# Patient Record
Sex: Female | Born: 1955
Health system: Southern US, Community
[De-identification: ages and names within clinical notes are randomized; demographics above are authoritative.]

## PROBLEM LIST (undated history)

## (undated) DIAGNOSIS — L723 Sebaceous cyst: Secondary | ICD-10-CM

## (undated) DIAGNOSIS — E039 Hypothyroidism, unspecified: Secondary | ICD-10-CM

## (undated) DIAGNOSIS — L309 Dermatitis, unspecified: Secondary | ICD-10-CM

## (undated) DIAGNOSIS — E785 Hyperlipidemia, unspecified: Secondary | ICD-10-CM

## (undated) DIAGNOSIS — M47816 Spondylosis without myelopathy or radiculopathy, lumbar region: Secondary | ICD-10-CM

## (undated) DIAGNOSIS — K219 Gastro-esophageal reflux disease without esophagitis: Secondary | ICD-10-CM

## (undated) DIAGNOSIS — L089 Local infection of the skin and subcutaneous tissue, unspecified: Secondary | ICD-10-CM

## (undated) HISTORY — DX: Hyperlipidemia, unspecified: E78.5

## (undated) HISTORY — DX: Dermatitis, unspecified: L30.9

## (undated) HISTORY — PX: VAGINAL HYSTERECTOMY: SUR661

## (undated) HISTORY — DX: Gastro-esophageal reflux disease without esophagitis: K21.9

## (undated) HISTORY — DX: Hypothyroidism, unspecified: E03.9

## (undated) HISTORY — DX: Spondylosis without myelopathy or radiculopathy, lumbar region: M47.816

---

## 1998-08-12 ENCOUNTER — Encounter: Payer: Self-pay | Admitting: Family Medicine

## 1998-08-12 ENCOUNTER — Ambulatory Visit (HOSPITAL_COMMUNITY): Admission: RE | Admit: 1998-08-12 | Discharge: 1998-08-12 | Payer: Self-pay | Admitting: Family Medicine

## 2000-01-26 ENCOUNTER — Ambulatory Visit (HOSPITAL_COMMUNITY): Admission: RE | Admit: 2000-01-26 | Discharge: 2000-01-26 | Payer: Self-pay | Admitting: Gastroenterology

## 2000-01-26 ENCOUNTER — Encounter: Payer: Self-pay | Admitting: Gastroenterology

## 2001-06-15 ENCOUNTER — Other Ambulatory Visit: Admission: RE | Admit: 2001-06-15 | Discharge: 2001-06-15 | Payer: Self-pay | Admitting: Family Medicine

## 2001-06-17 ENCOUNTER — Ambulatory Visit (HOSPITAL_COMMUNITY): Admission: RE | Admit: 2001-06-17 | Discharge: 2001-06-17 | Payer: Self-pay | Admitting: Family Medicine

## 2001-06-17 ENCOUNTER — Encounter: Payer: Self-pay | Admitting: Family Medicine

## 2001-06-20 ENCOUNTER — Ambulatory Visit (HOSPITAL_BASED_OUTPATIENT_CLINIC_OR_DEPARTMENT_OTHER): Admission: RE | Admit: 2001-06-20 | Discharge: 2001-06-20 | Payer: Self-pay | Admitting: Surgery

## 2002-06-23 ENCOUNTER — Other Ambulatory Visit: Admission: RE | Admit: 2002-06-23 | Discharge: 2002-06-23 | Payer: Self-pay | Admitting: Family Medicine

## 2003-04-08 ENCOUNTER — Ambulatory Visit (HOSPITAL_COMMUNITY): Admission: RE | Admit: 2003-04-08 | Discharge: 2003-04-08 | Payer: Self-pay | Admitting: Family Medicine

## 2005-04-20 ENCOUNTER — Other Ambulatory Visit: Admission: RE | Admit: 2005-04-20 | Discharge: 2005-04-20 | Payer: Self-pay | Admitting: *Deleted

## 2007-03-16 ENCOUNTER — Emergency Department (HOSPITAL_COMMUNITY): Admission: EM | Admit: 2007-03-16 | Discharge: 2007-03-16 | Payer: Self-pay | Admitting: Emergency Medicine

## 2009-03-07 ENCOUNTER — Other Ambulatory Visit: Admission: RE | Admit: 2009-03-07 | Discharge: 2009-03-07 | Payer: Self-pay | Admitting: Family Medicine

## 2009-04-01 IMAGING — CT CT HEAD W/O CM
1 series · 16 of 30 positions shown, 20 images · non-contrast
Comparison: None.

CLINICAL DATA: Syncope, headache, and blurred vision.
 HEAD CT WITHOUT CONTRAST ? 03/16/07:
TECHNIQUE: Contiguous axial CT images were obtained from the base of the skull through the vertex according to standard protocol without contrast.

[Series 2: head_seq 4.5 h37s st · axial · 0.43mm/px · z∈[+1063,+1189]mm · 16 of 32 slices shown, 20 images]
[im 2/32  brain]
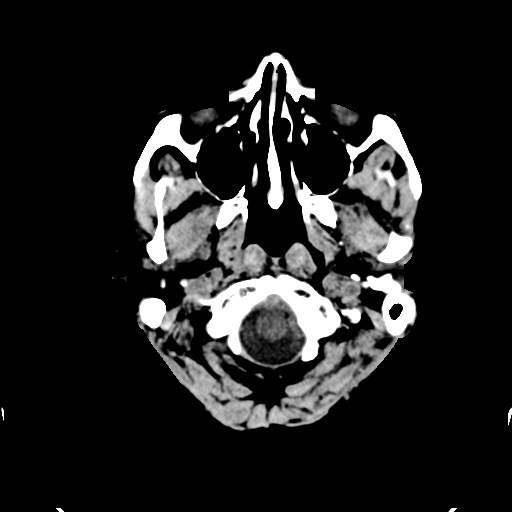
[im 2/32  bone]
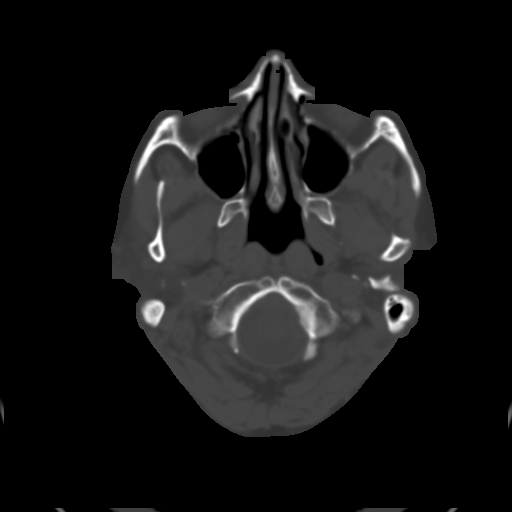
[im 4/32  brain]
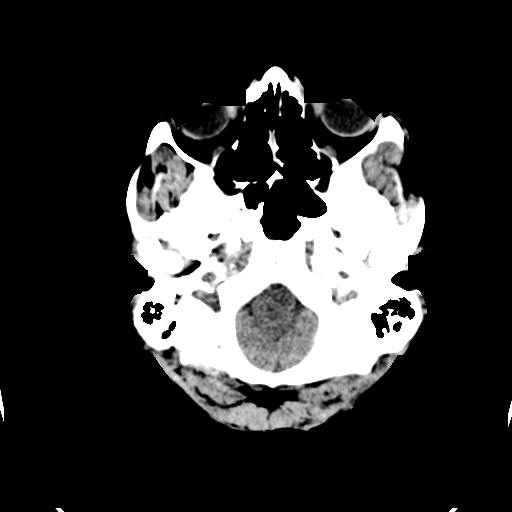
[im 6/32  brain]
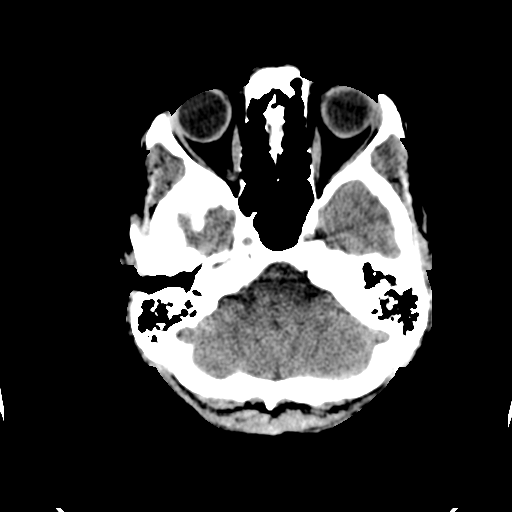
[im 8/32  brain]
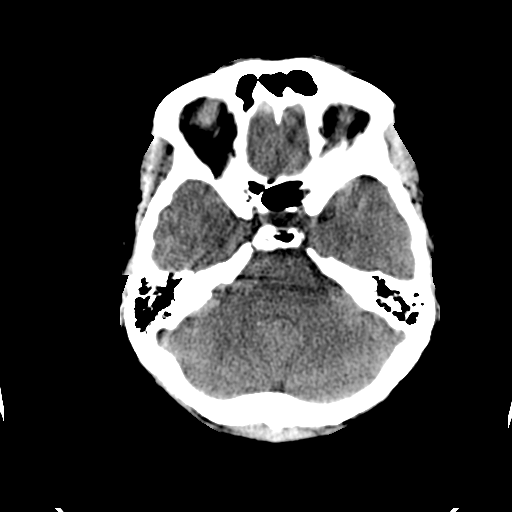
[im 9/32  brain]
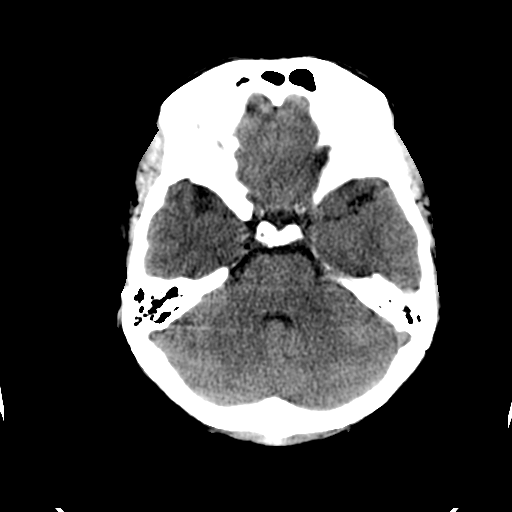
[im 9/32  bone]
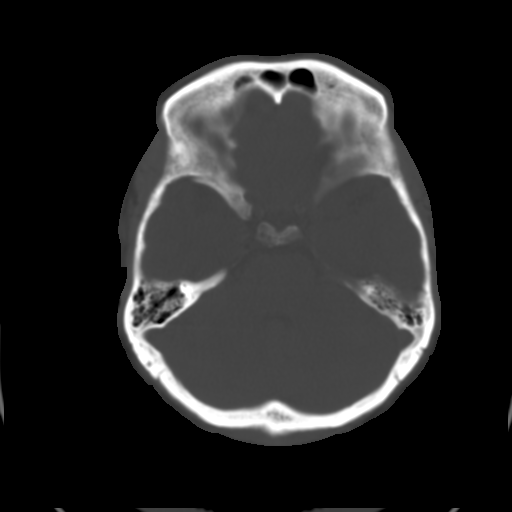
[im 11/32  brain]
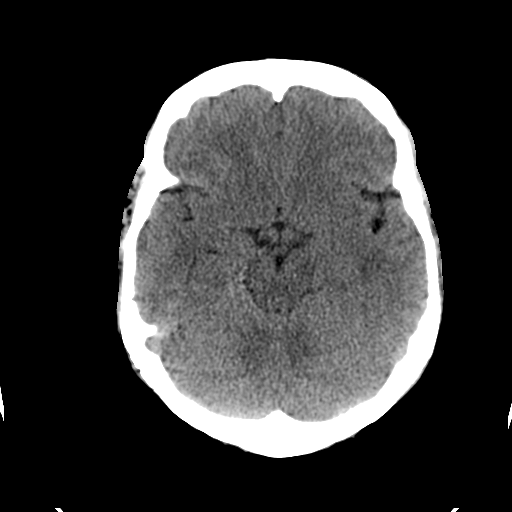
[im 13/32  brain]
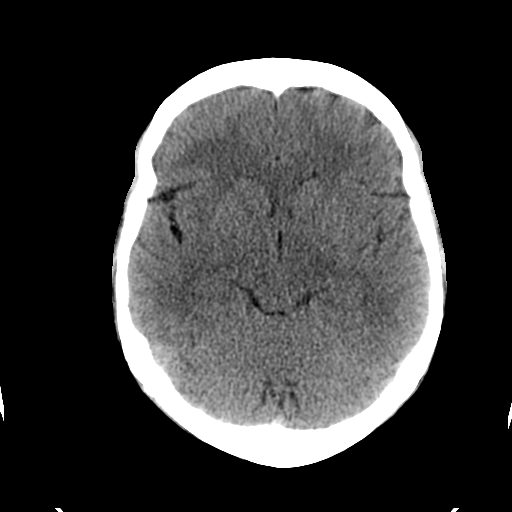
[im 15/32  brain]
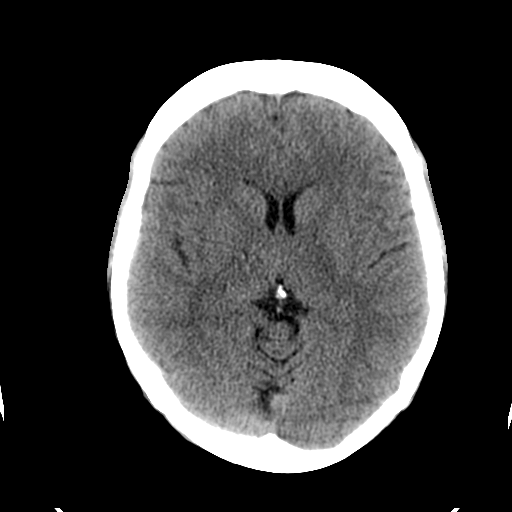
[im 17/32  brain]
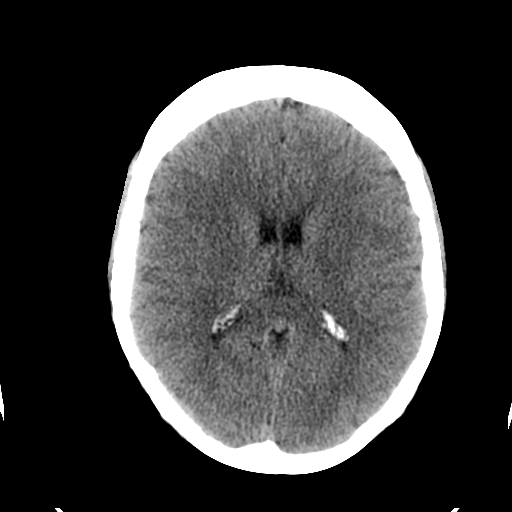
[im 17/32  bone]
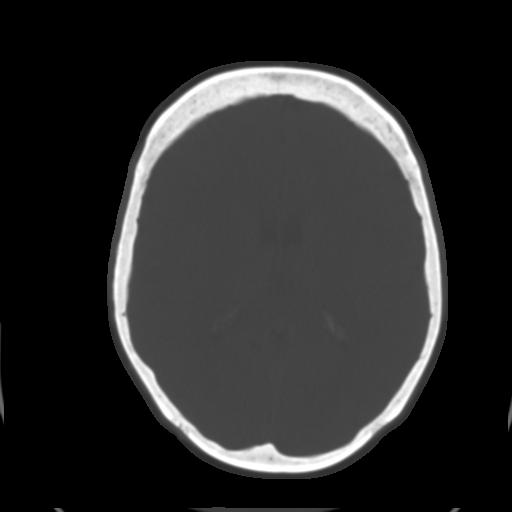
[im 19/32  brain]
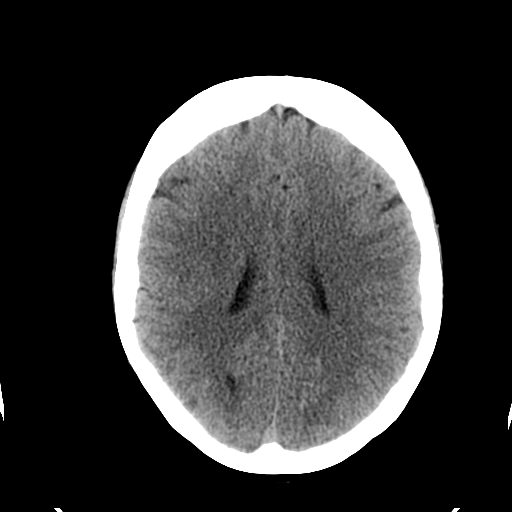
[im 21/32  brain]
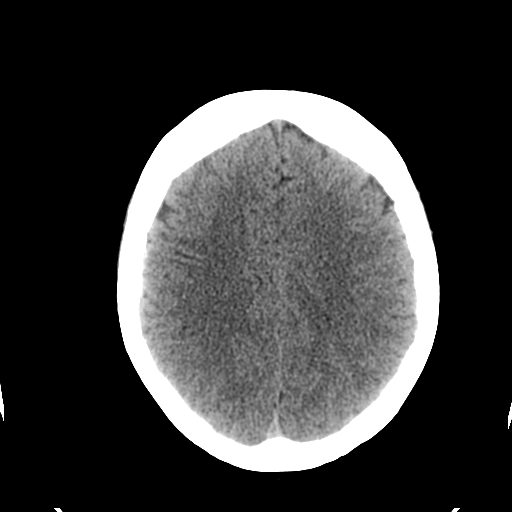
[im 23/32  brain]
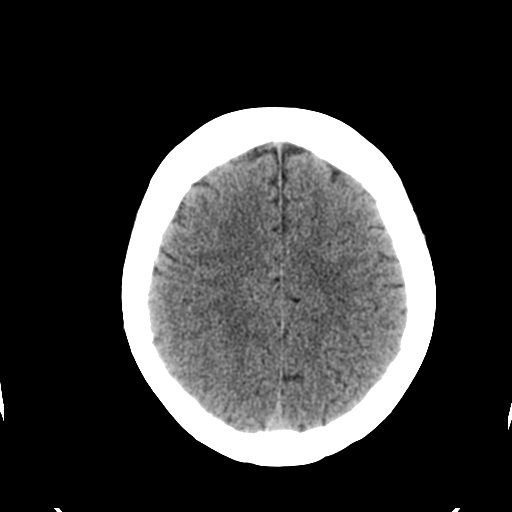
[im 24/32  brain]
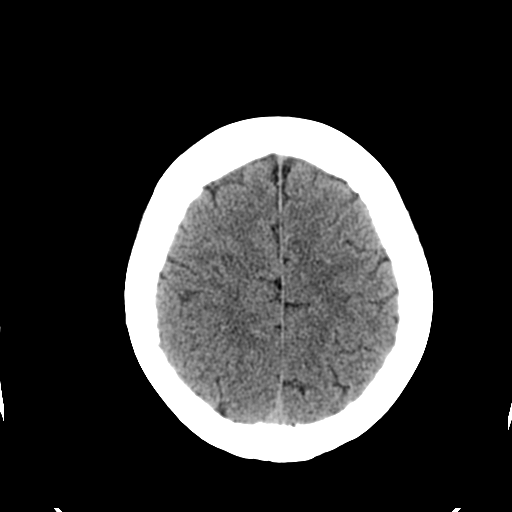
[im 24/32  bone]
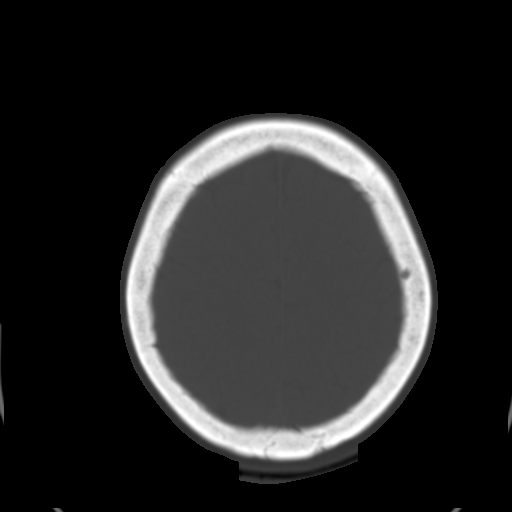
[im 26/32  brain]
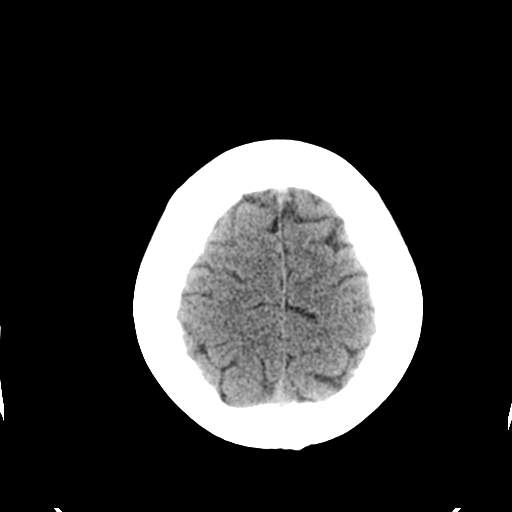
[im 28/32  brain]
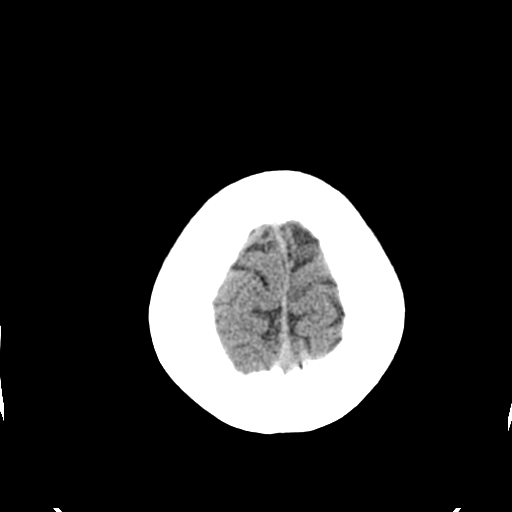
[im 30/32  brain]
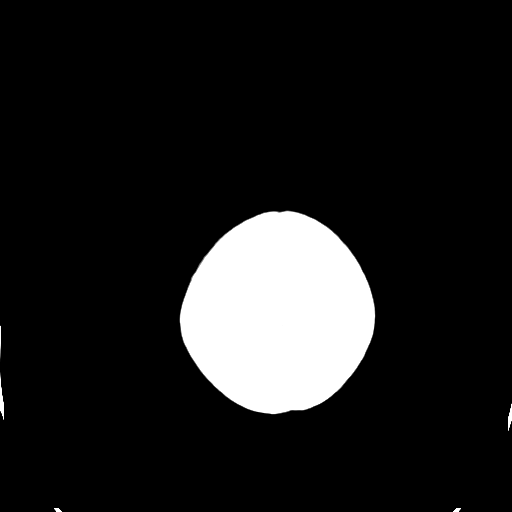

[16 of 30 positions shown; findings below may reference images not displayed]

FINDINGS: The cerebral and cerebellar hemispheres are normal in attenuation and morphology.  
 The midline is maintained.  There is no edema or mass effect.  
 There is no abnormal extra-axial fluid collection, intracranial hemorrhage, or mass.
 There is no evidence for acute cerebral infarction or hemorrhage.
 The mastoid air cells and paranasal sinuses are normally aerated.
IMPRESSION: No acute findings. 

 t

## 2010-05-23 NOTE — Op Note (Signed)
[place]  Patient:    [name], [name] Visit Number: [id number] MRN: [id number]          Service Type: DSU Location: [facility] Attending Physician:  Shelly Rubenstein Dictated by:   Abigail Miyamoto, M.D. Proc. Date: 06/10/01 Admit Date:  06/20/2001                             Operative Report  PREOPERATIVE DIAGNOSIS:  Chronic perianal abscess.  POSTOPERATIVE DIAGNOSIS:  Chronic perianal abscess.  OPERATION PERFORMED: 1. Examination under anesthesia. 2. Excision of chronic perianal abscess.  SURGEON:  Abigail Miyamoto, M.D.  ANESTHESIA:  General endotracheal anesthesia and 0.25% Marcaine with epinephrine.  ESTIMATED BLOOD LOSS:  Minimal.  INDICATIONS FOR PROCEDURE:  The patient is a pleasant 55 year old female who presents with recurrent perianal abscesses.  These have all occurred in the same location.  Secondary to this, the decision was made to proceed with examination under anesthesia.  FINDINGS:  The patient was found to have a normal anal canal.  No fistula was identified.  At this point decision was made to excise the area of the recurrent abscesses.  DESCRIPTION OF PROCEDURE:  Patient brought to operating room and identified as Jessica Farmer.  She was placed supine on the operating table and general anesthesia was induced.  The patient was then placed in the prone position. Her perineum and perianal area were then prepped and draped in the usual sterile fashion.  The Hill-Ferguson retractor was then inserted in the anus. The patient was found to have a large amount of stool which was removed. Examination revealed a normal anal canal.  The old abscess scar had healed well and no punctum could be identified.  At this point with no abnormalities on the inside to indicate a fistula, the decision was made to excise the abscess area.  At this point an ellipse was made around the old scar. Incision was carried down to the  perianal tissue which was then completely excised circumferentially.  Again, the fistula probe was inserted deep into this cavity and again no tract could be identified.  At this point a curet was brought onto the field and then used to curet out all the chronic tissue in the abscess cavity.  The wound was then irrigated with normal saline and then anesthetized with 0.25% Marcaine.  The skin was then closed with several interrupted 3-0 chromic sutures.  The patient tolerated the procedure well. All sponge, needle and instrument counts were correct at the end of the procedure.  The patient was then extubated in the operating room and taken in stable condition to the recovery room. Dictated by:   Abigail Miyamoto, M.D. Attending Physician:  Shelly Rubenstein DD:  06/20/01 TD:  06/21/01 Job: [id number]

## 2010-08-27 ENCOUNTER — Other Ambulatory Visit (HOSPITAL_COMMUNITY)
Admission: RE | Admit: 2010-08-27 | Discharge: 2010-08-27 | Disposition: A | Discharge status: Home / Self Care | Payer: Managed Care, Other (non HMO) | Source: Ambulatory Visit | Attending: Family Medicine | Admitting: Family Medicine

## 2010-08-27 ENCOUNTER — Other Ambulatory Visit: Payer: Self-pay | Admitting: Family Medicine

## 2010-08-27 DIAGNOSIS — Z124 Encounter for screening for malignant neoplasm of cervix: Secondary | ICD-10-CM | POA: Insufficient documentation

## 2010-09-29 LAB — DIFFERENTIAL
Basophils Absolute: 0
Basophils Relative: 0
Eosinophils Relative: 0
Lymphocytes Relative: 35
Monocytes Absolute: 0.6

## 2010-09-29 LAB — BASIC METABOLIC PANEL
BUN: 9
CO2: 28
Calcium: 9.8
GFR calc non Af Amer: >60
Glucose, Bld: 102 — ABNORMAL HIGH
Potassium: 3.9

## 2010-09-29 LAB — CBC
HCT: 38.8
Hemoglobin: 13.5
MCHC: 34.9
Platelets: 225
RDW: 13.5

## 2011-06-11 ENCOUNTER — Ambulatory Visit: Payer: Managed Care, Other (non HMO) | Admitting: Audiology

## 2011-06-18 ENCOUNTER — Ambulatory Visit: Payer: Managed Care, Other (non HMO) | Admitting: Audiology

## 2011-07-06 ENCOUNTER — Ambulatory Visit: Payer: Managed Care, Other (non HMO) | Attending: Family Medicine | Admitting: Audiology

## 2011-07-06 DIAGNOSIS — H903 Sensorineural hearing loss, bilateral: Secondary | ICD-10-CM | POA: Insufficient documentation

## 2011-07-07 ENCOUNTER — Other Ambulatory Visit: Payer: Self-pay | Admitting: Family Medicine

## 2011-07-07 DIAGNOSIS — H9319 Tinnitus, unspecified ear: Secondary | ICD-10-CM

## 2011-07-08 ENCOUNTER — Ambulatory Visit
Admission: RE | Admit: 2011-07-08 | Discharge: 2011-07-08 | Disposition: A | Discharge status: Home / Self Care | Payer: Managed Care, Other (non HMO) | Source: Ambulatory Visit | Attending: Family Medicine | Admitting: Family Medicine

## 2011-07-08 DIAGNOSIS — H9319 Tinnitus, unspecified ear: Secondary | ICD-10-CM

## 2011-07-08 MED ORDER — GADOBENATE DIMEGLUMINE 529 MG/ML IV SOLN
15.0000 mL | Freq: Once | INTRAVENOUS | Status: AC | PRN
  Administered 2011-07-08: 15 mL via INTRAVENOUS

## 2013-04-21 ENCOUNTER — Encounter: Payer: Self-pay | Admitting: Diagnostic Neuroimaging

## 2013-04-21 ENCOUNTER — Encounter (INDEPENDENT_AMBULATORY_CARE_PROVIDER_SITE_OTHER): Payer: Self-pay

## 2013-04-21 ENCOUNTER — Ambulatory Visit (INDEPENDENT_AMBULATORY_CARE_PROVIDER_SITE_OTHER): Payer: Medicare HMO | Admitting: Diagnostic Neuroimaging

## 2013-04-21 VITALS — BP 116/79 | HR 92 | Ht 63.0 in | Wt 171.0 lb

## 2013-04-21 DIAGNOSIS — G500 Trigeminal neuralgia: Secondary | ICD-10-CM

## 2013-04-21 MED ORDER — CARBAMAZEPINE ER 200 MG PO TB12: 200.0000 mg | ORAL_TABLET | Freq: Two times a day (BID) | ORAL | Status: DC

## 2013-04-21 NOTE — Progress Notes (Signed)
[facility]  PATIENT: Jessica Farmer DOB: 1955/10/01  REFERRING CLINICIAN: Drema Dallas HISTORY FROM: patient REASON FOR VISIT: new consult   HISTORICAL  CHIEF COMPLAINT:  Chief Complaint  Patient presents with  . Facial Pain    tingling on R side of bottom lip, jaw pain    HISTORY OF PRESENT ILLNESS:   58 year old right-handed female here for evaluation of right facial pain. For past 2 years patient has had intermittent shooting, sharp electrical pain in her right face. Symptoms radiate to her right lower lip. She has some numbness and tingling in the bottom lip on the right eye. Talking, eating, chewing sometimes triggers electrical burning zap sensations. Sometimes she has throbbing pain. Symptoms are worse in the wintertime. They affect her about 70% the day. She's tried Tylenol PM and hydrocodone without relief.  Also of note patient had poor dental hygiene as a child, lost family dental insurance, and ultimately lost her teeth at age 39 years old with complete dentures. 3 years ago her dentures were replaced in attempt to help these facial pain, but no change in sxs.   REVIEW OF SYSTEMS: Full 14 system review of systems performed and notable only for headache numbness surgeries to the swelling too much sleep none asleep decreased energy insomnia ringing in ears trouble swallowing cramps of feeling hot fatigue weight gain.  ALLERGIES: Allergies  Allergen Reactions  . Codeine     HOME MEDICATIONS: No outpatient prescriptions prior to visit.   No facility-administered medications prior to visit.    PAST MEDICAL HISTORY: Past Medical History  Diagnosis Date  . Hyperlipemia   . Esophageal reflux   . Eczema   . Hypothyroidism   . Arthritis, lumbar spine     PAST SURGICAL HISTORY: Past Surgical History  Procedure Laterality Date  . Vaginal hysterectomy  age 29    FAMILY HISTORY: Family History  Problem Relation Age of Onset  . Other  Mother     MRSA  . Hypertension Father   . Diabetes Sister   . Hypertension Sister   . Other Sister      trigeminal neuralgia  . Diabetes Brother     SOCIAL HISTORY:  History   Social History  . Marital Status: Married    Spouse Name: Ronalee Belts    Number of Children: 2  . Years of Education: 12th   Occupational History  .  [employer]   Social History Main Topics  . Smoking status: Current Every Day Smoker -- 0.50 packs/day for 40 years    Types: Cigarettes  . Smokeless tobacco: Never Used  . Alcohol Use: Yes     Comment: twice a week  . Drug Use: No  . Sexual Activity: Not on file   Other Topics Concern  . Not on file   Social History Narrative   Patient lives at home with spouse.   Caffeine Use: 3 cups daily     PHYSICAL EXAM  Filed Vitals:   04/21/13 1001  BP: 116/79  Pulse: 92  Height: $Remove'5\' 3"'dorGurE$  (1.6 m)  Weight: 171 lb (77.565 kg)    Not recorded    Body mass index is 30.3 kg/(m^2).  GENERAL EXAM: Patient is in no distress; well developed, nourished and groomed; neck is supple  CARDIOVASCULAR: Regular rate and rhythm, no murmurs, no carotid bruits  NEUROLOGIC: MENTAL STATUS: awake, alert, oriented to person, place and time, recent and remote memory intact, normal attention and concentration, language fluent, comprehension intact, naming  intact, fund of knowledge appropriate CRANIAL NERVE: no papilledema on fundoscopic exam, pupils equal and reactive to light, visual fields full to confrontation, extraocular muscles intact, no nystagmus, facial sensation and strength symmetric, hearing intact, palate elevates symmetrically, uvula midline, shoulder shrug symmetric, tongue midline. MOTOR: normal bulk and tone, full strength in the BUE, BLE SENSORY: normal and symmetric to light touch, pinprick, temperature, vibration COORDINATION: finger-nose-finger, fine finger movementsnormal REFLEXES: deep tendon reflexes present and symmetric GAIT/STATION: narrow  based gait; able to walk on toes, heels and tandem; romberg is negative    DIAGNOSTIC DATA (LABS, IMAGING, TESTING) - I reviewed patient records, labs, notes, testing and imaging myself where available.  Lab Results  Component Value Date   WBC 12.5* 03/16/2007   HGB 13.5 03/16/2007   HCT 38.8 03/16/2007   MCV 86.3 03/16/2007   PLT 225 03/16/2007      Component Value Date/Time   NA 142 03/16/2007 1636   K 3.9 03/16/2007 1636   CL 107 03/16/2007 1636   CO2 28 03/16/2007 1636   GLUCOSE 102* 03/16/2007 1636   BUN 9 03/16/2007 1636   CREATININE 0.73 03/16/2007 1636   CALCIUM 9.8 03/16/2007 1636   GFRNONAA >60 03/16/2007 1636   GFRAA  Value: >60        The eGFR has been calculated using the MDRD equation. This calculation has not been validated in all clinical 03/16/2007 1636   No results found for this basename: CHOL, HDL, LDLCALC, LDLDIRECT, TRIG, CHOLHDL   No results found for this basename: HGBA1C   No results found for this basename: VITAMINB12   No results found for this basename: TSH    I reviewed images myself and included my interpretation below. -VRP  07/08/11 MRI brain and CN5 (with and without) - small vessel loop slightly abutting the medial aspect of right trigeminal nerve cisternal segment (seriees 9, images 31-37). Otherwise normal.   ASSESSMENT AND PLAN  58 y.o. year old female here with right trigeminal neuralgia, may be secondary to small blood vessel loop near the right trigeminal nerve cisternal segment, which is seen on MRI from 2013. Will try patient on carbamazepine.   PLAN: - CBZ XR $Remo'200mg'IsHkS$  qhs x 1 week, then increase to BID  Return in about 3 months (around 07/21/2013).    Penni Bombard, MD 7/56/4332, 95:18 AM Certified in Neurology, Neurophysiology and Neuroimaging  [facility] [address] [place], [place] [address] [phone]

## 2013-04-21 NOTE — Patient Instructions (Addendum)
NEW MEDICATION:   Start carbamazepine XR 200mg  at bedtime for 1 week, then increase to twice a day.  ###############################################################################################################################################################################  Carbamazepine extended-release tablets What is this medicine? CARBAMAZEPINE (kar ba MAZ e peen) is used to control seizures caused by certain types of epilepsy. This medicine is also used to treat nerve related pain. It is not for common aches and pains. This medicine may be used for other purposes; ask your health care provider or pharmacist if you have questions. COMMON BRAND NAME(S): Tegretol -XR What should I tell my health care provider before I take this medicine? They need to know if you have any of these conditions: -Asian ancestry -bone marrow disease -glaucoma -heart disease or irregular heartbeat -kidney disease -liver disease -low blood counts, like low white cell, platelet, or red cell counts -porphyria -psychotic disorders -suicidal thoughts, plans, or attempt; a previous suicide attempt by you or a family member -an unusual or allergic reaction to carbamazepine, tricyclic antidepressants, phenytoin, phenobarbital or other medicines, foods, dyes, or preservatives -pregnant or trying to get pregnant -breast-feeding How should I use this medicine? Take this medicine by mouth with a glass of water. Follow the directions on the prescription label. Swallow whole, do not cut, crush or chew. The tablets should be inspected for chips or cracks. Do not take any tablets that are damaged. Take this medicine with food. Take your doses at regular intervals. Do not take your medicine more often than directed. Do not stop taking this medicine except on the advice of your doctor or health care professional. A special MedGuide will be given to you by the pharmacist with each prescription and refill. Be sure to read  this information carefully each time. Talk to your pediatrician regarding the use of this medicine in children. Special care may be needed. Overdosage: If you think you have taken too much of this medicine contact a poison control center or emergency room at once. NOTE: This medicine is only for you. Do not share this medicine with others. What if I miss a dose? If you miss a dose, take it as soon as you can. If it is almost time for your next dose, take only that dose. Do not take double or extra doses. What may interact with this medicine? Do not take this medicine with any of the following medications: -delavirdine -MAOIs like Carbex, Eldepryl, Marplan, Nardil, and Parnate -nefazodone -oxcarbazepine This medicine may also interact with the following medications: -acetaminophen -acetazolamide -barbiturate medicines for inducing sleep or treating seizures, like phenobarbital -certain antibiotics like clarithromycin, erythromycin or troleandomycin -cimetidine -cyclosporine -danazol -dicumarol -doxycycline -female hormones, including estrogens and birth control pills -grapefruit juice -isoniazid, INH -levothyroxine and other thyroid hormones -lithium and other medicines to treat mood problems or psychotic disturbances -loratadine -medicines for angina or high blood pressure -medicines for cancer -medicines for depression or anxiety -medicines for sleep -medicines to treat fungal infections, like fluconazole, itraconazole or ketoconazole -medicines used to treat HIV infection or AIDS -methadone -niacinamide -praziquantel -propoxyphene -rifampin or rifabutin -seizure or epilepsy medicine -steroid medicines such as prednisone or cortisone -theophylline -tramadol -warfarin This list may not describe all possible interactions. Give your health care provider a list of all the medicines, herbs, non-prescription drugs, or dietary supplements you use. Also tell them if you smoke,  drink alcohol, or use illegal drugs. Some items may interact with your medicine. What should I watch for while using this medicine? Visit your doctor or health care professional for a regular check on your  progress. Do not change brands or dosage forms of this medicine without discussing the change with your doctor or health care professional. If you are taking this medicine for epilepsy (seizures) do not stop taking it suddenly. This increases the risk of seizures. Wear a Arboriculturist or necklace. Carry an identification card with information about your condition, medications, and doctor or health care professional. Bonita Quin may get drowsy, dizzy, or have blurred vision. Do not drive, use machinery, or do anything that needs mental alertness until you know how this medicine affects you. To reduce dizzy or fainting spells, do not sit or stand up quickly, especially if you are an older patient. Alcohol can increase drowsiness and dizziness. Avoid alcoholic drinks. Birth control pills may not work properly while you are taking this medicine. Talk to your doctor about using an extra method of birth control. This medicine can make you more sensitive to the sun. Keep out of the sun. If you cannot avoid being in the sun, wear protective clothing and use sunscreen. Do not use sun lamps or tanning beds/booths. The coating on the tablets is not absorbed in the body and you may notice it in your stool. This is no cause for concern. The use of this medicine may increase the chance of suicidal thoughts or actions. Pay special attention to how you are responding while on this medicine. Any worsening of mood, or thoughts of suicide or dying should be reported to your health care professional right away. Women who become pregnant while using this medicine may enroll in the [place] American Antiepileptic Drug Pregnancy Registry by calling [phone]. This registry collects information about the safety of antiepileptic  drug use during pregnancy. What side effects may I notice from receiving this medicine? Side effects that you should report to your doctor or health care professional as soon as possible: -allergic reactions like skin rash, itching or hives, swelling of the face, lips, or tongue -breathing problems -changes in vision -confusion -dark urine -fast or irregular heartbeat -fever or chills, sore throat -mouth ulcers -pain or difficulty passing urine -redness, blistering, peeling or loosening of the skin, including inside the mouth -ringing in the ears -seizures -stomach pain -swollen joints or muscle/joint aches and pains -unusual bleeding or bruising -unusually weak or tired -vomiting -worsening of mood, thoughts or actions of suicide or dying -yellowing of the eyes or skin Side effects that usually do not require medical attention (report to your doctor or health care professional if they continue or are bothersome): -clumsiness or unsteadiness -diarrhea or constipation -headache -increased sweating -nausea This list may not describe all possible side effects. Call your doctor for medical advice about side effects. You may report side effects to FDA at 1-800-FDA-1088. Where should I keep my medicine? Keep out of reach of children. Store at room temperature between 15 and 30 degrees C (59 and 86 degrees F). Keep container tightly closed. Protect from moisture. Throw away any unused medicine after the expiration date. NOTE: This sheet is a summary. It may not cover all possible information. If you have questions about this medicine, talk to your doctor, pharmacist, or health care provider.  2014, Elsevier/Gold Standard. (2012-03-01 15:30:31)     ##################################################################################################################################################################  Trigeminal Neuralgia Trigeminal neuralgia is a nerve disorder that causes  sudden attacks of severe facial pain. It is caused by damage to the trigeminal nerve, a major nerve in the face. It is more common in women and in the elderly, although it can also happen  in younger patients. Attacks last from a few seconds to several minutes and can occur from a couple of times per year to several times per day. Trigeminal neuralgia can be a very distressing and disabling condition. Surgery may be needed in very severe cases if medical treatment does not give relief. HOME CARE INSTRUCTIONS   If your caregiver prescribed medication to help prevent attacks, take as directed.  To help prevent attacks:  Chew on the unaffected side of the mouth.  Avoid touching your face.  Avoid blasts of hot or cold air.  Men may wish to grow a beard to avoid having to shave. SEEK IMMEDIATE MEDICAL CARE IF:  Pain is unbearable and your medicine does not help.  You develop new, unexplained symptoms (problems).  You have problems that may be related to a medication you are taking. Document Released: 12/20/1999 Document Revised: 03/16/2011 Document Reviewed: 10/19/2008 [facility] Patient Information 2014 [place], [place].

## 2013-04-27 ENCOUNTER — Telehealth: Payer: Self-pay | Admitting: Diagnostic Neuroimaging

## 2013-04-27 MED ORDER — CARBAMAZEPINE 100 MG PO CHEW: 100.0000 mg | CHEWABLE_TABLET | Freq: Three times a day (TID) | ORAL | Status: DC

## 2013-04-27 NOTE — Telephone Encounter (Signed)
Called patient and shared message per Dr Richrd HumblesPenumalli's note below, she verbalized understanding

## 2013-04-27 NOTE — Telephone Encounter (Signed)
Pt is calling stating that the medication carbamazepine 200 mg that Dr. Marjory LiesPenumalli started pt on is to strong. Pt states she is having bad headaches, nausea, itching and stiffness in her neck, pt states she would like to try a lower dose. Please advise

## 2013-04-27 NOTE — Telephone Encounter (Signed)
Patient calling to state that the recent medication that Dr. Marjory LiesPenumalli started her on (carbamazepine 200 mg) is too strong. She is having really bad headaches, nausea, itching and stiffness in her neck. She would like to try a lower dosage if possible maybe 100 mg. Please call to advise.

## 2013-04-27 NOTE — Telephone Encounter (Signed)
Will change carbamazepine to 100mg  three times per day. I sent in rx. Let patient know. -VRP

## 2013-07-14 ENCOUNTER — Encounter: Payer: Self-pay | Admitting: Diagnostic Neuroimaging

## 2013-07-14 ENCOUNTER — Telehealth: Payer: Self-pay | Admitting: Diagnostic Neuroimaging

## 2013-07-14 NOTE — Telephone Encounter (Signed)
Spoke to patient regarding rescheduling 07/24/13 appointment per Dr. Penumalli's schedule, patient verbalized understanding. Printed and mailed letter with new appointment time.  °

## 2013-07-24 ENCOUNTER — Ambulatory Visit: Payer: Medicare HMO | Admitting: Diagnostic Neuroimaging

## 2013-09-18 ENCOUNTER — Ambulatory Visit: Payer: Medicare HMO | Admitting: Diagnostic Neuroimaging

## 2013-12-07 ENCOUNTER — Other Ambulatory Visit (HOSPITAL_COMMUNITY)
Admission: RE | Admit: 2013-12-07 | Discharge: 2013-12-07 | Disposition: A | Discharge status: Home / Self Care | Payer: Managed Care, Other (non HMO) | Source: Ambulatory Visit | Attending: Obstetrics & Gynecology | Admitting: Obstetrics & Gynecology

## 2013-12-07 ENCOUNTER — Other Ambulatory Visit: Payer: Self-pay | Admitting: Obstetrics & Gynecology

## 2013-12-07 DIAGNOSIS — Z01419 Encounter for gynecological examination (general) (routine) without abnormal findings: Secondary | ICD-10-CM | POA: Insufficient documentation

## 2013-12-07 DIAGNOSIS — Z1151 Encounter for screening for human papillomavirus (HPV): Secondary | ICD-10-CM | POA: Insufficient documentation

## 2013-12-08 LAB — CYTOLOGY - PAP

## 2014-04-06 HISTORY — PX: OTHER SURGICAL HISTORY: SHX169

## 2014-10-02 ENCOUNTER — Encounter: Payer: Self-pay | Admitting: Diagnostic Neuroimaging

## 2014-10-02 ENCOUNTER — Ambulatory Visit (INDEPENDENT_AMBULATORY_CARE_PROVIDER_SITE_OTHER): Payer: BLUE CROSS/BLUE SHIELD | Admitting: Diagnostic Neuroimaging

## 2014-10-02 VITALS — BP 116/73 | HR 104 | Ht 63.0 in | Wt 170.1 lb

## 2014-10-02 DIAGNOSIS — G500 Trigeminal neuralgia: Secondary | ICD-10-CM

## 2014-10-02 MED ORDER — CARBAMAZEPINE 100 MG PO CHEW: 100.0000 mg | CHEWABLE_TABLET | Freq: Four times a day (QID) | ORAL | Status: DC

## 2014-10-02 NOTE — Patient Instructions (Signed)
Thank you for coming to see [place] at [facility]. I hope we have been able to provide you high quality care today.  You may receive a patient satisfaction survey over the next few weeks. We would appreciate your feedback and comments so that we may continue to improve ourselves and the health of our patients.  - try carbamazepine 178m three times per day up to 4 tabs per day (100/100/200)   ~~~~~~~~~~~~~~~~~~~~~~~~~~~~~~~~~~~~~~~~~~~~~~~~~~~~~~~~~~~~~~~~~  DR. PENUMALLI'S GUIDE TO HAPPY AND HEALTHY LIVING These are some of my general health and wellness recommendations. Some of them may apply to you better than others. Please use common sense as you try these suggestions and feel free to ask me any questions.   ACTIVITY/FITNESS Mental, social, emotional and physical stimulation are very important for brain and body health. Try learning a new activity (arts, music, language, sports, games).  Keep moving your body to the best of your abilities. You can do this at home, inside or outside, the park, community center, gym or anywhere you like. Consider a physical therapist or personal trainer to get started. Consider the app Sworkit. Fitness trackers such as smart-watches, smart-phones or Fitbits can help as well.   NUTRITION Eat more plants: colorful vegetables, nuts, seeds and berries.  Eat less sugar, salt, preservatives and processed foods.  Avoid toxins such as cigarettes and alcohol.  Drink water when you are thirsty. Warm water with a slice of lemon is an excellent morning drink to start the day.  Consider these websites for more information The Nutrition Source (hhttps://www.henry-hernandez.biz/ Prec[employer] Nutrition (wWindowBlog.ch   RELAXATION Consider practicing mindfulness meditation or other relaxation techniques such as deep breathing, prayer, yoga, tai chi, massage. See website mindful.org or the apps Headspace or  Calm to help get started.   SLEEP Try to get at least 7-8+ hours sleep per day. Regular exercise and reduced caffeine will help you sleep better. Practice good sleep hygeine techniques. See website sleep.org for more information.   PLANNING Prepare estate planning, living will, healthcare POA documents. Sometimes this is best planned with the help of an attorney. Theconversationproject.org and agingwithdignity.org are excellent resources.

## 2014-10-02 NOTE — Progress Notes (Signed)
[facility]  PATIENT: Jessica Farmer DOB: [birth year]  REFERRING CLINICIAN: Drema Dallas HISTORY FROM: patient REASON FOR VISIT: follow up   HISTORICAL  CHIEF COMPLAINT:  Chief Complaint  Patient presents with  . Trigeminal neuralgia    rm 7  . Follow-up    last seen 04/2013    HISTORY OF PRESENT ILLNESS:   UPDATE 10/02/14: Since last visit, tried CBZ which helped a little bit. Took until June 2015, then stopped due to diff in chewing. Then had lower dental implants for denture attachment in April 2016.   PRIOR HPI (04/21/13): 59 year old right-handed female here for evaluation of right facial pain. For past 2 years patient has had intermittent shooting, sharp electrical pain in her right face. Symptoms radiate to her right lower lip. She has some numbness and tingling in the bottom lip on the right eye. Talking, eating, chewing sometimes triggers electrical burning zap sensations. Sometimes she has throbbing pain. Symptoms are worse in the wintertime. They affect her about 70% the day. She's tried Tylenol PM and hydrocodone without relief.  Also of note patient had poor dental hygiene as a child, lost family dental insurance, and ultimately lost her teeth at age 32 years old with complete dentures. 3 years ago her dentures were replaced in attempt to help these facial pain, but no change in sxs.   REVIEW OF SYSTEMS: Full 14 system review of systems performed and notable only for heat intol fatigue blurred vision.    ALLERGIES: Allergies  Allergen Reactions  . Codeine Nausea And Vomiting    HOME MEDICATIONS: Outpatient Prescriptions Prior to Visit  Medication Sig Dispense Refill  . CRESTOR 10 MG tablet Take 1 tablet by mouth daily.    Marland Kitchen esomeprazole (NEXIUM) 40 MG capsule Take 1 capsule by mouth daily.    Marland Kitchen levothyroxine (SYNTHROID, LEVOTHROID) 112 MCG tablet Take 1 tablet by mouth daily.    . valACYclovir (VALTREX) 1000 MG tablet Take 1 tablet by mouth  2 (two) times daily as needed.    . carbamazepine (TEGRETOL) 100 MG chewable tablet Chew 1 tablet (100 mg total) by mouth 3 (three) times daily. (Patient not taking: Reported on 10/02/2014) 90 tablet 12   No facility-administered medications prior to visit.    PAST MEDICAL HISTORY: Past Medical History  Diagnosis Date  . Hyperlipemia   . Esophageal reflux   . Eczema   . Hypothyroidism   . Arthritis, lumbar spine     PAST SURGICAL HISTORY: Past Surgical History  Procedure Laterality Date  . Vaginal hysterectomy  age 36  . Dental implants Right 04/2014    bottom jaw    FAMILY HISTORY: Family History  Problem Relation Age of Onset  . Other Mother     MRSA  . Hypertension Father   . Diabetes Sister   . Hypertension Sister   . Other Sister      trigeminal neuralgia  . Diabetes Brother   . Stroke Brother     x 2    SOCIAL HISTORY:  Social History   Social History  . Marital Status: Married    Spouse Name: Ronalee Belts  . Number of Children: 2  . Years of Education: 12th   Occupational History  .  [employer]   Social History Main Topics  . Smoking status: Current Every Day Smoker -- 0.50 packs/day for 41 years    Types: Cigarettes  . Smokeless tobacco: Never Used  . Alcohol Use: Yes     Comment:  twice a week  . Drug Use: No  . Sexual Activity: Not on file   Other Topics Concern  . Not on file   Social History Narrative   Patient lives at home with spouse.   Caffeine Use: 3 cups daily     PHYSICAL EXAM  Filed Vitals:   10/02/14 1510  BP: 116/73  Pulse: 104  Height: $Remove'5\' 3"'jRhfKgf$  (1.6 m)  Weight: 170 lb 1.6 oz (77.157 kg)    Not recorded      Body mass index is 30.14 kg/(m^2).  GENERAL EXAM: Patient is in no distress; well developed, nourished and groomed; neck is supple  CARDIOVASCULAR: Regular rate and rhythm, no murmurs, no carotid bruits  NEUROLOGIC: MENTAL STATUS: awake, alert, language fluent, comprehension intact, naming intact, fund of  knowledge appropriate CRANIAL NERVE: pupils equal and reactive to light, visual fields full to confrontation, extraocular muscles intact, no nystagmus, facial sensation and strength symmetric, hearing intact, palate elevates symmetrically, uvula midline, shoulder shrug symmetric, tongue midline. MOTOR: normal bulk and tone, full strength in the BUE, BLE SENSORY: normal and symmetric to light touch COORDINATION: finger-nose-finger, fine finger movementsnormal REFLEXES: deep tendon reflexes present and symmetric GAIT/STATION: narrow based gait; able to walk tandem; romberg is negative    DIAGNOSTIC DATA (LABS, IMAGING, TESTING) - I reviewed patient records, labs, notes, testing and imaging myself where available.  Lab Results  Component Value Date   WBC 12.5* 03/16/2007   HGB 13.5 03/16/2007   HCT 38.8 03/16/2007   MCV 86.3 03/16/2007   PLT 225 03/16/2007      Component Value Date/Time   NA 142 03/16/2007 1636   K 3.9 03/16/2007 1636   CL 107 03/16/2007 1636   CO2 28 03/16/2007 1636   GLUCOSE 102* 03/16/2007 1636   BUN 9 03/16/2007 1636   CREATININE 0.73 03/16/2007 1636   CALCIUM 9.8 03/16/2007 1636   GFRNONAA >60 03/16/2007 1636   GFRAA  03/16/2007 1636    >60        The eGFR has been calculated using the MDRD equation. This calculation has not been validated in all clinical   No results found for: CHOL No results found for: HGBA1C No results found for: VITAMINB12 No results found for: TSH  07/08/11 MRI brain and CN5 (with and without) - small vessel loop slightly abutting the medial aspect of right trigeminal nerve cisternal segment (series 9, images 31-37). Otherwise normal.    ASSESSMENT AND PLAN  59 y.o. year old female here with right trigeminal neuralgia, may be secondary to small blood vessel loop near the right trigeminal nerve cisternal segment, which is seen on MRI from 2013. Will restart patient on carbamazepine.   PLAN: - CBZ $Rem'100mg'mfEk$  TID-QID; may crush and  mix with apple sauce  Meds ordered this encounter  Medications  . carbamazepine (TEGRETOL) 100 MG chewable tablet    Sig: Chew 1 tablet (100 mg total) by mouth 4 (four) times daily.    Dispense:  120 tablet    Refill:  12   Return in about 3 months (around 01/01/2015).    Penni Bombard, MD 5/78/4696, 2:95 PM Certified in Neurology, Neurophysiology and Neuroimaging  [facility] [address] [place], [place] [address] [phone]

## 2015-01-08 ENCOUNTER — Ambulatory Visit: Payer: BLUE CROSS/BLUE SHIELD | Admitting: Diagnostic Neuroimaging

## 2015-01-30 ENCOUNTER — Ambulatory Visit (INDEPENDENT_AMBULATORY_CARE_PROVIDER_SITE_OTHER): Payer: BLUE CROSS/BLUE SHIELD

## 2015-01-30 ENCOUNTER — Ambulatory Visit: Payer: BLUE CROSS/BLUE SHIELD

## 2015-01-30 ENCOUNTER — Ambulatory Visit (INDEPENDENT_AMBULATORY_CARE_PROVIDER_SITE_OTHER): Payer: BLUE CROSS/BLUE SHIELD | Admitting: Podiatry

## 2015-01-30 ENCOUNTER — Encounter: Payer: Self-pay | Admitting: Podiatry

## 2015-01-30 VITALS — BP 115/75 | HR 104 | Resp 16 | Ht 63.0 in | Wt 173.0 lb

## 2015-01-30 DIAGNOSIS — M722 Plantar fascial fibromatosis: Secondary | ICD-10-CM

## 2015-01-30 DIAGNOSIS — L600 Ingrowing nail: Secondary | ICD-10-CM | POA: Diagnosis not present

## 2015-01-30 MED ORDER — TRIAMCINOLONE ACETONIDE 10 MG/ML IJ SUSP
10.0000 mg | Freq: Once | INTRAMUSCULAR | Status: AC
  Administered 2015-01-30: 10 mg

## 2015-01-30 MED ORDER — PREDNISONE 10 MG PO TABS: ORAL_TABLET | ORAL | Status: DC

## 2015-01-30 NOTE — Progress Notes (Signed)
   Subjective:    Patient ID: Jessica Farmer, female    DOB: 1955/06/27, 60 y.o.   MRN: [id number]  HPI Patient presents with bilateral foot pain; arch and heel; pt stated, "right foot is worse than left";  X1.5.  Patient also presents with a nail problem on their right foot, great toe; nail discoloration and thickened nail; x4 yrs.   Review of Systems  Constitutional: Positive for diaphoresis and fatigue.  All other systems reviewed and are negative.      Objective:   Physical Exam        Assessment & Plan:

## 2015-01-30 NOTE — Patient Instructions (Signed)

## 2015-02-15 ENCOUNTER — Ambulatory Visit (INDEPENDENT_AMBULATORY_CARE_PROVIDER_SITE_OTHER): Payer: BLUE CROSS/BLUE SHIELD | Admitting: Podiatry

## 2015-02-15 ENCOUNTER — Encounter: Payer: Self-pay | Admitting: Podiatry

## 2015-02-15 VITALS — BP 119/79 | HR 120 | Resp 16

## 2015-02-15 DIAGNOSIS — M722 Plantar fascial fibromatosis: Secondary | ICD-10-CM

## 2015-02-15 DIAGNOSIS — L600 Ingrowing nail: Secondary | ICD-10-CM | POA: Diagnosis not present

## 2015-02-15 MED ORDER — NEOMYCIN-POLYMYXIN-HC 3.5-10000-1 OT SOLN: OTIC | Status: DC

## 2015-02-15 NOTE — Patient Instructions (Signed)

## 2015-02-18 NOTE — Progress Notes (Signed)
Subjective:     Patient ID: UMI MAINOR, female   DOB: July 14, 1955, 60 y.o.   MRN: [id number]  HPI patient states my heel is feeling quite a bit better with thick hallux and second nail right that bother me and make it hard to wear shoe gear comfortably   Review of Systems     Objective:   Physical Exam Neurovascular status intact muscle strength adequate range of motion within normal limits with severe thickness of the hallux and second nail right that are painful when pressed dystrophic and impossible for patient to cut. Heel is doing well    Assessment:     Improved from fasciitis condition with damaged hallux and second nail right that are painful and thickened    Plan:     Reviewed condition at great length and discussed procedure and risk associated with nail removal. Patient wants procedure understanding that the goal is for this to be a permanent type correction and at this time I went ahead infiltrated the right hallux and second toe with 120 mg Xylocaine Marcaine mixture remove the hallux nail and second nail right exposed matrix and applied phenol 5 applications 30 seconds followed by alcohol lavaged and sterile dressing. Gave instructions on soaks and reappoint

## 2015-02-25 ENCOUNTER — Telehealth: Payer: Self-pay | Admitting: *Deleted

## 2015-02-25 NOTE — Telephone Encounter (Signed)
Called patient at [phone] (Home #) to check to see how they were doing from when they got two nails removed on Friday, February 10, 2015. Pt stated, "Has on and off pain; big toe feels fine, but the 2nd toe is still hurting and still has moisture". Pt is soaking their toes with some relief and taking ibuprofen for pain.

## 2015-03-15 ENCOUNTER — Ambulatory Visit (INDEPENDENT_AMBULATORY_CARE_PROVIDER_SITE_OTHER): Payer: BLUE CROSS/BLUE SHIELD | Admitting: Podiatry

## 2015-03-15 ENCOUNTER — Encounter: Payer: Self-pay | Admitting: Podiatry

## 2015-03-15 VITALS — BP 101/68 | HR 110 | Temp 97.9°F | Resp 16

## 2015-03-15 DIAGNOSIS — M722 Plantar fascial fibromatosis: Secondary | ICD-10-CM | POA: Diagnosis not present

## 2015-03-15 DIAGNOSIS — L600 Ingrowing nail: Secondary | ICD-10-CM | POA: Diagnosis not present

## 2015-03-16 NOTE — Progress Notes (Signed)
Subjective:     Patient ID: Domenic PoliteWanda G Cua, female   DOB: 07/09/1955, [age] y.o.   MRN: [id number]  HPI patient states she wanted to have her nails checked on her right foot and also she wanted to discuss her heel pain which is improving but still present   Review of Systems     Objective:   Physical Exam Neurovascular status intact with patient still noted to have discomfort in the plantar aspect of the right heel mild to moderate in nature and is noted to have healed nail site right hallux and second with mild redness noted in the proximal portion which is most likely due to chemical    Assessment:     Doing well with nail surgery first second right with no indications of pathology with plantar fasciitis which is improving right    Plan:     Discussed continued bandage technique for day and letting him air dry at night if any drainage redness were to occur with nails to reappoint immediately. Gave instructions on physical therapy and continued shoe gear usage for plantar fasciitis and will be seen back as needed

## 2015-04-18 ENCOUNTER — Ambulatory Visit (INDEPENDENT_AMBULATORY_CARE_PROVIDER_SITE_OTHER): Payer: BLUE CROSS/BLUE SHIELD | Admitting: Podiatry

## 2015-04-18 ENCOUNTER — Encounter: Payer: Self-pay | Admitting: Podiatry

## 2015-04-18 ENCOUNTER — Ambulatory Visit (INDEPENDENT_AMBULATORY_CARE_PROVIDER_SITE_OTHER): Payer: BLUE CROSS/BLUE SHIELD

## 2015-04-18 VITALS — BP 112/74 | HR 112 | Resp 16

## 2015-04-18 DIAGNOSIS — M722 Plantar fascial fibromatosis: Secondary | ICD-10-CM | POA: Diagnosis not present

## 2015-04-18 DIAGNOSIS — M25472 Effusion, left ankle: Secondary | ICD-10-CM

## 2015-04-18 MED ORDER — TRIAMCINOLONE ACETONIDE 10 MG/ML IJ SUSP
10.0000 mg | Freq: Once | INTRAMUSCULAR | Status: AC
  Administered 2015-04-18: 10 mg

## 2015-04-21 NOTE — Progress Notes (Signed)
Subjective:     Patient ID: Jessica PoliteWanda G Farmer, female   DOB: 02/12/1955, 60 y.o.   MRN: [id number]  HPI patient states my heel seems improved but my ankle is swelling with pain in the outside joint   Review of Systems     Objective:   Physical Exam Neurovascular status intact muscle strength adequate with mild plantar fascial symptomatology with patient having sinus tarsi symptoms left with inflammation and swelling noted    Assessment:     Sinus tarsitis left with inflammatory changes with mild plantar fasciitis right noted    Plan:     H&P conditions reviewed and careful sinus tarsi injection administered with sheath injection lateral 3 mg Kenalog 5 mg Xylocaine and applied anklet to reduce swelling. Reappoint to recheck again in the next 3-4 weeks or earlier if any issues should occur

## 2015-07-24 DIAGNOSIS — E039 Hypothyroidism, unspecified: Secondary | ICD-10-CM | POA: Diagnosis not present

## 2015-07-24 DIAGNOSIS — R609 Edema, unspecified: Secondary | ICD-10-CM | POA: Diagnosis not present

## 2015-07-24 DIAGNOSIS — E7800 Pure hypercholesterolemia, unspecified: Secondary | ICD-10-CM | POA: Diagnosis not present

## 2015-07-24 DIAGNOSIS — K219 Gastro-esophageal reflux disease without esophagitis: Secondary | ICD-10-CM | POA: Diagnosis not present

## 2015-11-29 DIAGNOSIS — R05 Cough: Secondary | ICD-10-CM | POA: Diagnosis not present

## 2015-11-29 DIAGNOSIS — J9801 Acute bronchospasm: Secondary | ICD-10-CM | POA: Diagnosis not present

## 2015-11-29 DIAGNOSIS — B349 Viral infection, unspecified: Secondary | ICD-10-CM | POA: Diagnosis not present

## 2015-12-06 DIAGNOSIS — Z720 Tobacco use: Secondary | ICD-10-CM | POA: Diagnosis not present

## 2015-12-06 DIAGNOSIS — J329 Chronic sinusitis, unspecified: Secondary | ICD-10-CM | POA: Diagnosis not present

## 2015-12-31 DIAGNOSIS — R05 Cough: Secondary | ICD-10-CM | POA: Diagnosis not present

## 2015-12-31 DIAGNOSIS — J9801 Acute bronchospasm: Secondary | ICD-10-CM | POA: Diagnosis not present

## 2016-01-14 DIAGNOSIS — R252 Cramp and spasm: Secondary | ICD-10-CM | POA: Diagnosis not present

## 2016-01-14 DIAGNOSIS — J209 Acute bronchitis, unspecified: Secondary | ICD-10-CM | POA: Diagnosis not present

## 2016-02-24 DIAGNOSIS — Z1231 Encounter for screening mammogram for malignant neoplasm of breast: Secondary | ICD-10-CM | POA: Diagnosis not present

## 2016-02-24 DIAGNOSIS — Z803 Family history of malignant neoplasm of breast: Secondary | ICD-10-CM | POA: Diagnosis not present

## 2016-05-19 DIAGNOSIS — E039 Hypothyroidism, unspecified: Secondary | ICD-10-CM | POA: Diagnosis not present

## 2016-05-19 DIAGNOSIS — E7800 Pure hypercholesterolemia, unspecified: Secondary | ICD-10-CM | POA: Diagnosis not present

## 2016-06-30 DIAGNOSIS — R946 Abnormal results of thyroid function studies: Secondary | ICD-10-CM | POA: Diagnosis not present

## 2016-08-04 DIAGNOSIS — L723 Sebaceous cyst: Secondary | ICD-10-CM | POA: Diagnosis not present

## 2016-08-17 DIAGNOSIS — L718 Other rosacea: Secondary | ICD-10-CM | POA: Diagnosis not present

## 2016-08-17 DIAGNOSIS — B078 Other viral warts: Secondary | ICD-10-CM | POA: Diagnosis not present

## 2016-08-17 DIAGNOSIS — L820 Inflamed seborrheic keratosis: Secondary | ICD-10-CM | POA: Diagnosis not present

## 2016-11-23 DIAGNOSIS — E039 Hypothyroidism, unspecified: Secondary | ICD-10-CM | POA: Diagnosis not present

## 2016-11-23 DIAGNOSIS — E7800 Pure hypercholesterolemia, unspecified: Secondary | ICD-10-CM | POA: Diagnosis not present

## 2016-12-01 DIAGNOSIS — M545 Low back pain: Secondary | ICD-10-CM | POA: Diagnosis not present

## 2017-01-26 DIAGNOSIS — R21 Rash and other nonspecific skin eruption: Secondary | ICD-10-CM | POA: Diagnosis not present

## 2017-01-26 DIAGNOSIS — R51 Headache: Secondary | ICD-10-CM | POA: Diagnosis not present

## 2017-01-26 DIAGNOSIS — R42 Dizziness and giddiness: Secondary | ICD-10-CM | POA: Diagnosis not present

## 2017-03-01 DIAGNOSIS — Z1231 Encounter for screening mammogram for malignant neoplasm of breast: Secondary | ICD-10-CM | POA: Diagnosis not present

## 2017-05-24 DIAGNOSIS — F17210 Nicotine dependence, cigarettes, uncomplicated: Secondary | ICD-10-CM | POA: Diagnosis not present

## 2017-05-24 DIAGNOSIS — E039 Hypothyroidism, unspecified: Secondary | ICD-10-CM | POA: Diagnosis not present

## 2017-05-24 DIAGNOSIS — K219 Gastro-esophageal reflux disease without esophagitis: Secondary | ICD-10-CM | POA: Diagnosis not present

## 2017-05-24 DIAGNOSIS — E7800 Pure hypercholesterolemia, unspecified: Secondary | ICD-10-CM | POA: Diagnosis not present

## 2017-08-04 DIAGNOSIS — Z1159 Encounter for screening for other viral diseases: Secondary | ICD-10-CM | POA: Diagnosis not present

## 2017-08-04 DIAGNOSIS — E039 Hypothyroidism, unspecified: Secondary | ICD-10-CM | POA: Diagnosis not present

## 2017-10-18 DIAGNOSIS — D123 Benign neoplasm of transverse colon: Secondary | ICD-10-CM | POA: Diagnosis not present

## 2017-10-18 DIAGNOSIS — Z1211 Encounter for screening for malignant neoplasm of colon: Secondary | ICD-10-CM | POA: Diagnosis not present

## 2017-10-18 DIAGNOSIS — D122 Benign neoplasm of ascending colon: Secondary | ICD-10-CM | POA: Diagnosis not present

## 2017-10-18 DIAGNOSIS — K5730 Diverticulosis of large intestine without perforation or abscess without bleeding: Secondary | ICD-10-CM | POA: Diagnosis not present

## 2017-10-18 DIAGNOSIS — D175 Benign lipomatous neoplasm of intra-abdominal organs: Secondary | ICD-10-CM | POA: Diagnosis not present

## 2017-10-19 DIAGNOSIS — N611 Abscess of the breast and nipple: Secondary | ICD-10-CM | POA: Diagnosis not present

## 2017-10-20 DIAGNOSIS — D123 Benign neoplasm of transverse colon: Secondary | ICD-10-CM | POA: Diagnosis not present

## 2017-10-20 DIAGNOSIS — D122 Benign neoplasm of ascending colon: Secondary | ICD-10-CM | POA: Diagnosis not present

## 2017-10-20 DIAGNOSIS — Z1211 Encounter for screening for malignant neoplasm of colon: Secondary | ICD-10-CM | POA: Diagnosis not present

## 2017-10-21 DIAGNOSIS — L258 Unspecified contact dermatitis due to other agents: Secondary | ICD-10-CM | POA: Diagnosis not present

## 2017-10-31 DIAGNOSIS — T7840XA Allergy, unspecified, initial encounter: Secondary | ICD-10-CM | POA: Diagnosis not present

## 2017-11-04 DIAGNOSIS — N611 Abscess of the breast and nipple: Secondary | ICD-10-CM | POA: Diagnosis not present

## 2017-11-15 ENCOUNTER — Other Ambulatory Visit: Payer: Self-pay

## 2017-11-15 ENCOUNTER — Ambulatory Visit: Payer: Self-pay | Admitting: Surgery

## 2017-11-15 ENCOUNTER — Encounter (HOSPITAL_BASED_OUTPATIENT_CLINIC_OR_DEPARTMENT_OTHER): Payer: Self-pay | Admitting: *Deleted

## 2017-11-15 DIAGNOSIS — L089 Local infection of the skin and subcutaneous tissue, unspecified: Secondary | ICD-10-CM | POA: Diagnosis not present

## 2017-11-15 DIAGNOSIS — L723 Sebaceous cyst: Secondary | ICD-10-CM | POA: Diagnosis not present

## 2017-11-15 DIAGNOSIS — N611 Abscess of the breast and nipple: Secondary | ICD-10-CM | POA: Diagnosis not present

## 2017-11-15 NOTE — H&P (View-Only) (Signed)
History of Present Illness (Brycin Kille K. Esley Brooking MD; 11/15/2017 11:52 AM) The patient is a 62 year old female who presents with a breast abscess. Referred by Dr. Elizabeth Barnes for right medial breast abscess PCP Dr. Victoria Rankins This is a 62-year-old female who presents with a three-week history of a slowly enlarging tender mass in her medial right breast. She states that prior to this time she had a very small palpable mass in this area. It became more tender and enlarged. She was seen by Dr. Barnes on 10/19/17. Unfortunately there were some delay in being scheduled in our office. She was on antibiotics (doxycycline). She has now finished a course of these antibiotics. The abscess ruptured spontaneously and drained a large amount of purulent fluid.   She was seen on 11/04/17. The wound had sealed. She has no residual tenderness but still has some firmness around this area. She has a second sebaceous cyst on her right upper abdominal wall just below the breast. Final recommendation at that time was to let some of the residual firmness decrease around the original cyst and then excised both cyst electively. However both cyst have now become infected and had spontaneously drained. She comes back to the office today for evaluation.   Allergies (Kelsey Phillips, CMA; 11/15/2017 10:39 AM) CODEINE Nausea, Vomiting. Allergies Reconciled  Medication History (Kelsey Phillips, CMA; 11/15/2017 10:39 AM) Rosuvastatin Calcium (10MG Tablet, Oral) Active. valACYclovir HCl (500MG Tablet, Oral) Active. Esomeprazole Magnesium (40MG Capsule DR, Oral) Active. Levothyroxine Sodium (112MCG Tablet, Oral) Active. Medications Reconciled    Vitals (Kelsey Phillips CMA; 11/15/2017 10:39 AM) 11/15/2017 10:38 AM Weight: 175.25 lb Height: 63in Body Surface Area: 1.83 m Body Mass Index: 31.04 kg/m  Temp.: 97.8F  Pulse: 122 (Regular)  BP: 138/78 (Sitting, Left Arm,  Standard)      Physical Exam (Ashaunti Treptow K. Roby Donaway MD; 11/15/2017 11:56 AM)  The physical exam findings are as follows: Note:WDWN in NAD Eyes: Pupils equal, round; sclera anicteric HENT: Oral mucosa moist; good dentition Neck: No masses palpated, no thyromegaly Lungs: CTA bilaterally; normal respiratory effort Right breast - medial inframammary crease - small pinpoint opening with a 2 cm palpable mass medial to the opening; slight erythema and tenderness to the skin. CV: Regular rate and rhythm; no murmurs; extremities well-perfused with no edema Abd: In the RUQ just below the right breast, the palpable cyst has some slight erythema and a pinpoint opening with some whitish drainage. +bowel sounds, soft, non-tender, no palpable organomegaly; no palpable hernias Skin: Warm, dry; no sign of jaundice Psychiatric - alert and oriented x 4; calm mood and affect    Assessment & Plan (Imir Brumbach K. Haizel Gatchell MD; 11/15/2017 10:52 AM)  ABSCESS OF BREAST, RIGHT (N61.1)   INFECTED SEBACEOUS CYST (L72.3) Impression: Right upper abdominal wall 2 cm  Current Plans Schedule for Surgery - Excision of two infected sebaceous cysts - right breast and right upper abdominal wall. The surgical procedure has been discussed with the patient. Potential risks, benefits, alternative treatments, and expected outcomes have been explained. All of the patient's questions at this time have been answered. The likelihood of reaching the patient's treatment goal is good. The patient understand the proposed surgical procedure and wishes to proceed.  Shoni Quijas K. Damont Balles, MD, FACS [facility]  General/ Trauma Surgery Beeper [phone]  11/15/2017 11:56 AM   

## 2017-11-15 NOTE — H&P (Signed)
History of Present Illness Jessica Farmer. Gloris Shiroma MD; 11/15/2017 11:52 AM) The patient is a 62 year old female who presents with a breast abscess. Referred by Dr. Juluis Rainier for right medial breast abscess PCP Dr. Beverley Fiedler This is a 62 year old female who presents with a three-week history of a slowly enlarging tender mass in her medial right breast. She states that prior to this time she had a very small palpable mass in this area. It became more tender and enlarged. She was seen by Dr. Zachery Dauer on 10/19/17. Unfortunately there were some delay in being scheduled in our office. She was on antibiotics (doxycycline). She has now finished a course of these antibiotics. The abscess ruptured spontaneously and drained a large amount of purulent fluid.   She was seen on 11/04/17. The wound had sealed. She has no residual tenderness but still has some firmness around this area. She has a second sebaceous cyst on her right upper abdominal wall just below the breast. Final recommendation at that time was to let some of the residual firmness decrease around the original cyst and then excised both cyst electively. However both cyst have now become infected and had spontaneously drained. She comes back to the office today for evaluation.   Allergies Santiago Glad, [place]; 11/15/2017 10:39 AM) CODEINE Nausea, Vomiting. Allergies Reconciled  Medication History Santiago Glad, CMA; 11/15/2017 10:39 AM) Rosuvastatin Calcium (10MG  Tablet, Oral) Active. valACYclovir HCl (500MG  Tablet, Oral) Active. Esomeprazole Magnesium (40MG  Capsule DR, Oral) Active. Levothyroxine Sodium ( Tablet, Oral) Active. Medications Reconciled    Vitals Santiago Glad CMA; 11/15/2017 10:39 AM) 11/15/2017 10:38 AM Weight: 175.25 lb Height: 63in Body Surface Area: 1.83 m Body Mass Index: 31.04 kg/m  Temp.: 97.45F  Pulse: 122 (Regular)  BP: 138/78 (Sitting, Left Arm,  Standard)      Physical Exam Molli Hazard K. Brooke Payes MD; 11/15/2017 11:56 AM)  The physical exam findings are as follows: Note:WDWN in NAD Eyes: Pupils equal, round; sclera anicteric HENT: Oral mucosa moist; good dentition Neck: No masses palpated, no thyromegaly Lungs: CTA bilaterally; normal respiratory effort Right breast - medial inframammary crease - small pinpoint opening with a 2 cm palpable mass medial to the opening; slight erythema and tenderness to the skin. CV: Regular rate and rhythm; no murmurs; extremities well-perfused with no edema Abd: In the RUQ just below the right breast, the palpable cyst has some slight erythema and a pinpoint opening with some whitish drainage. +bowel sounds, soft, non-tender, no palpable organomegaly; no palpable hernias Skin: Warm, dry; no sign of jaundice Psychiatric - alert and oriented x 4; calm mood and affect    Assessment & Plan Molli Hazard K. Shanee Batch MD; 11/15/2017 10:52 AM)  ABSCESS OF BREAST, RIGHT (N61.1)   INFECTED SEBACEOUS CYST (L72.3) Impression: Right upper abdominal wall 2 cm  Current Plans Schedule for Surgery - Excision of two infected sebaceous cysts - right breast and right upper abdominal wall. The surgical procedure has been discussed with the patient. Potential risks, benefits, alternative treatments, and expected outcomes have been explained. All of the patient's questions at this time have been answered. The likelihood of reaching the patient's treatment goal is good. The patient understand the proposed surgical procedure and wishes to proceed.  Jessica Farmer. Corliss Skains, MD, [facility]  General/ Trauma Surgery Beeper [phone]  11/15/2017 11:56 AM

## 2017-11-16 ENCOUNTER — Other Ambulatory Visit: Payer: Self-pay

## 2017-11-16 ENCOUNTER — Ambulatory Visit (HOSPITAL_BASED_OUTPATIENT_CLINIC_OR_DEPARTMENT_OTHER)
Admission: RE | Admit: 2017-11-16 | Discharge: 2017-11-16 | Disposition: A | Discharge status: Home / Self Care | Payer: BLUE CROSS/BLUE SHIELD | Source: Ambulatory Visit | Attending: Surgery | Admitting: Surgery

## 2017-11-16 ENCOUNTER — Encounter (HOSPITAL_BASED_OUTPATIENT_CLINIC_OR_DEPARTMENT_OTHER): Payer: Self-pay | Admitting: *Deleted

## 2017-11-16 ENCOUNTER — Encounter (HOSPITAL_BASED_OUTPATIENT_CLINIC_OR_DEPARTMENT_OTHER)
Admission: RE | Disposition: A | Discharge status: Home / Self Care | Payer: Self-pay | Source: Ambulatory Visit | Attending: Surgery

## 2017-11-16 ENCOUNTER — Ambulatory Visit (HOSPITAL_BASED_OUTPATIENT_CLINIC_OR_DEPARTMENT_OTHER): Payer: BLUE CROSS/BLUE SHIELD | Admitting: Anesthesiology

## 2017-11-16 DIAGNOSIS — Z79899 Other long term (current) drug therapy: Secondary | ICD-10-CM | POA: Insufficient documentation

## 2017-11-16 DIAGNOSIS — M1990 Unspecified osteoarthritis, unspecified site: Secondary | ICD-10-CM | POA: Diagnosis not present

## 2017-11-16 DIAGNOSIS — L02213 Cutaneous abscess of chest wall: Secondary | ICD-10-CM | POA: Diagnosis not present

## 2017-11-16 DIAGNOSIS — F17200 Nicotine dependence, unspecified, uncomplicated: Secondary | ICD-10-CM | POA: Insufficient documentation

## 2017-11-16 DIAGNOSIS — L720 Epidermal cyst: Secondary | ICD-10-CM | POA: Diagnosis not present

## 2017-11-16 DIAGNOSIS — L723 Sebaceous cyst: Secondary | ICD-10-CM | POA: Diagnosis not present

## 2017-11-16 DIAGNOSIS — K219 Gastro-esophageal reflux disease without esophagitis: Secondary | ICD-10-CM | POA: Diagnosis not present

## 2017-11-16 DIAGNOSIS — Z885 Allergy status to narcotic agent status: Secondary | ICD-10-CM | POA: Insufficient documentation

## 2017-11-16 DIAGNOSIS — E039 Hypothyroidism, unspecified: Secondary | ICD-10-CM | POA: Insufficient documentation

## 2017-11-16 HISTORY — DX: Local infection of the skin and subcutaneous tissue, unspecified: L08.9

## 2017-11-16 HISTORY — PX: BREAST CYST EXCISION: SHX579

## 2017-11-16 HISTORY — DX: Sebaceous cyst: L72.3

## 2017-11-16 SURGERY — EXCISION, CYST, BREAST
Anesthesia: General | Site: Breast | Laterality: Right

## 2017-11-16 MED ORDER — PROPOFOL 10 MG/ML IV BOLUS
INTRAVENOUS | Status: DC | PRN
  Administered 2017-11-16: 150 mg via INTRAVENOUS

## 2017-11-16 MED ORDER — LIDOCAINE HCL (CARDIAC) PF 100 MG/5ML IV SOSY
PREFILLED_SYRINGE | INTRAVENOUS | Status: DC | PRN
  Administered 2017-11-16: 100 mg via INTRAVENOUS

## 2017-11-16 MED ORDER — DEXAMETHASONE SODIUM PHOSPHATE 10 MG/ML IJ SOLN
INTRAMUSCULAR | Status: AC
  Filled 2017-11-16: qty 1

## 2017-11-16 MED ORDER — OXYCODONE HCL 5 MG PO TABS: 5.0000 mg | ORAL_TABLET | Freq: Once | ORAL | Status: DC | PRN

## 2017-11-16 MED ORDER — LACTATED RINGERS IV SOLN
INTRAVENOUS | Status: DC
  Administered 2017-11-16: 11:00:00 via INTRAVENOUS

## 2017-11-16 MED ORDER — LIDOCAINE 2% (20 MG/ML) 5 ML SYRINGE
INTRAMUSCULAR | Status: AC
  Filled 2017-11-16: qty 5

## 2017-11-16 MED ORDER — CHLORHEXIDINE GLUCONATE CLOTH 2 % EX PADS: 6.0000 | MEDICATED_PAD | Freq: Once | CUTANEOUS | Status: DC

## 2017-11-16 MED ORDER — MIDAZOLAM HCL 5 MG/5ML IJ SOLN
INTRAMUSCULAR | Status: DC | PRN
  Administered 2017-11-16: 2 mg via INTRAVENOUS

## 2017-11-16 MED ORDER — MIDAZOLAM HCL 2 MG/2ML IJ SOLN: 1.0000 mg | INTRAMUSCULAR | Status: DC | PRN

## 2017-11-16 MED ORDER — FENTANYL CITRATE (PF) 100 MCG/2ML IJ SOLN
INTRAMUSCULAR | Status: AC
  Filled 2017-11-16: qty 2

## 2017-11-16 MED ORDER — FENTANYL CITRATE (PF) 100 MCG/2ML IJ SOLN: 25.0000 ug | INTRAMUSCULAR | Status: DC | PRN

## 2017-11-16 MED ORDER — DEXAMETHASONE SODIUM PHOSPHATE 10 MG/ML IJ SOLN
INTRAMUSCULAR | Status: DC | PRN
  Administered 2017-11-16: 5 mg via INTRAVENOUS

## 2017-11-16 MED ORDER — KETOROLAC TROMETHAMINE 30 MG/ML IJ SOLN
INTRAMUSCULAR | Status: AC
  Filled 2017-11-16: qty 1

## 2017-11-16 MED ORDER — ONDANSETRON HCL 4 MG/2ML IJ SOLN
INTRAMUSCULAR | Status: DC | PRN
  Administered 2017-11-16: 4 mg via INTRAVENOUS

## 2017-11-16 MED ORDER — ONDANSETRON 4 MG PO TBDP: 4.0000 mg | ORAL_TABLET | Freq: Three times a day (TID) | ORAL | 0 refills | Status: DC | PRN

## 2017-11-16 MED ORDER — PROPOFOL 10 MG/ML IV BOLUS
INTRAVENOUS | Status: AC
  Filled 2017-11-16: qty 20

## 2017-11-16 MED ORDER — CEFAZOLIN SODIUM-DEXTROSE 2-4 GM/100ML-% IV SOLN
INTRAVENOUS | Status: AC
  Filled 2017-11-16: qty 100

## 2017-11-16 MED ORDER — ONDANSETRON HCL 4 MG/2ML IJ SOLN
INTRAMUSCULAR | Status: AC
  Filled 2017-11-16: qty 2

## 2017-11-16 MED ORDER — FENTANYL CITRATE (PF) 100 MCG/2ML IJ SOLN
INTRAMUSCULAR | Status: DC | PRN
  Administered 2017-11-16: 100 ug via INTRAVENOUS

## 2017-11-16 MED ORDER — PROMETHAZINE HCL 25 MG/ML IJ SOLN: 6.2500 mg | INTRAMUSCULAR | Status: DC | PRN

## 2017-11-16 MED ORDER — FENTANYL CITRATE (PF) 100 MCG/2ML IJ SOLN: 50.0000 ug | INTRAMUSCULAR | Status: DC | PRN

## 2017-11-16 MED ORDER — SCOPOLAMINE 1 MG/3DAYS TD PT72: 1.0000 | MEDICATED_PATCH | Freq: Once | TRANSDERMAL | Status: DC | PRN

## 2017-11-16 MED ORDER — MEPERIDINE HCL 25 MG/ML IJ SOLN: 6.2500 mg | INTRAMUSCULAR | Status: DC | PRN

## 2017-11-16 MED ORDER — OXYCODONE HCL 5 MG/5ML PO SOLN: 5.0000 mg | Freq: Once | ORAL | Status: DC | PRN

## 2017-11-16 MED ORDER — KETOROLAC TROMETHAMINE 30 MG/ML IJ SOLN
30.0000 mg | Freq: Once | INTRAMUSCULAR | Status: AC
  Administered 2017-11-16: 30 mg via INTRAVENOUS

## 2017-11-16 MED ORDER — CEFAZOLIN SODIUM-DEXTROSE 2-4 GM/100ML-% IV SOLN
2.0000 g | INTRAVENOUS | Status: AC
  Administered 2017-11-16: 2 g via INTRAVENOUS

## 2017-11-16 MED ORDER — BUPIVACAINE-EPINEPHRINE 0.25% -1:200000 IJ SOLN
INTRAMUSCULAR | Status: DC | PRN
  Administered 2017-11-16: 20 mL

## 2017-11-16 MED ORDER — HYDROCODONE-ACETAMINOPHEN 5-325 MG PO TABS: 1.0000 | ORAL_TABLET | Freq: Four times a day (QID) | ORAL | 0 refills | Status: DC | PRN

## 2017-11-16 MED ORDER — MIDAZOLAM HCL 2 MG/2ML IJ SOLN
INTRAMUSCULAR | Status: AC
  Filled 2017-11-16: qty 2

## 2017-11-16 SURGICAL SUPPLY — 52 items
BENZOIN TINCTURE PRP APPL 2/3 (GAUZE/BANDAGES/DRESSINGS) IMPLANT
BLADE CLIPPER SURG (BLADE) IMPLANT
BLADE SURG 15 STRL LF DISP TIS (BLADE) ×2 IMPLANT
BLADE SURG 15 STRL SS (BLADE) ×1
CANISTER SUCT 1200ML W/VALVE (MISCELLANEOUS) ×3 IMPLANT
CHLORAPREP W/TINT 26ML (MISCELLANEOUS) ×3 IMPLANT
COVER BACK TABLE 60X90IN (DRAPES) ×3 IMPLANT
COVER MAYO STAND STRL (DRAPES) ×3 IMPLANT
COVER WAND RF STERILE (DRAPES) IMPLANT
DECANTER SPIKE VIAL GLASS SM (MISCELLANEOUS) IMPLANT
DERMABOND ADVANCED (GAUZE/BANDAGES/DRESSINGS) ×1
DERMABOND ADVANCED .7 DNX12 (GAUZE/BANDAGES/DRESSINGS) ×2 IMPLANT
DRAIN PENROSE 1/4X12 LTX STRL (WOUND CARE) ×3 IMPLANT
DRAPE LAPAROTOMY 100X72 PEDS (DRAPES) ×3 IMPLANT
DRAPE UTILITY XL STRL (DRAPES) ×3 IMPLANT
DRSG TEGADERM 2-3/8X2-3/4 SM (GAUZE/BANDAGES/DRESSINGS) IMPLANT
DRSG TEGADERM 4X4.75 (GAUZE/BANDAGES/DRESSINGS) IMPLANT
ELECT COATED BLADE 2.86 ST (ELECTRODE) ×3 IMPLANT
ELECT REM PT RETURN 9FT ADLT (ELECTROSURGICAL) ×3
ELECTRODE REM PT RTRN 9FT ADLT (ELECTROSURGICAL) ×2 IMPLANT
GAUZE SPONGE 4X4 12PLY STRL (GAUZE/BANDAGES/DRESSINGS) ×3 IMPLANT
GAUZE SPONGE 4X4 12PLY STRL LF (GAUZE/BANDAGES/DRESSINGS) IMPLANT
GLOVE BIO SURGEON STRL SZ7 (GLOVE) ×3 IMPLANT
GLOVE BIOGEL PI IND STRL 7.0 (GLOVE) ×4 IMPLANT
GLOVE BIOGEL PI IND STRL 7.5 (GLOVE) ×2 IMPLANT
GLOVE BIOGEL PI INDICATOR 7.0 (GLOVE) ×2
GLOVE BIOGEL PI INDICATOR 7.5 (GLOVE) ×1
GLOVE ECLIPSE 6.5 STRL STRAW (GLOVE) ×3 IMPLANT
GLOVE EXAM NITRILE MD LF STRL (GLOVE) ×3 IMPLANT
GLOVE SKINSENSE NS SZ7.5 (GLOVE) ×1
GLOVE SKINSENSE STRL SZ7.5 (GLOVE) ×2 IMPLANT
GOWN STRL REUS W/ TWL LRG LVL3 (GOWN DISPOSABLE) ×6 IMPLANT
GOWN STRL REUS W/TWL LRG LVL3 (GOWN DISPOSABLE) ×3
NEEDLE HYPO 25X1 1.5 SAFETY (NEEDLE) ×3 IMPLANT
NS IRRIG 1000ML POUR BTL (IV SOLUTION) ×3 IMPLANT
PACK BASIN DAY SURGERY FS (CUSTOM PROCEDURE TRAY) ×3 IMPLANT
PENCIL BUTTON HOLSTER BLD 10FT (ELECTRODE) ×3 IMPLANT
SLEEVE SCD COMPRESS KNEE MED (MISCELLANEOUS) ×3 IMPLANT
SPONGE GAUZE 2X2 8PLY STRL LF (GAUZE/BANDAGES/DRESSINGS) IMPLANT
STRIP CLOSURE SKIN 1/2X4 (GAUZE/BANDAGES/DRESSINGS) IMPLANT
SUT ETHILON 2 0 FS 18 (SUTURE) ×6 IMPLANT
SUT MON AB 4-0 PC3 18 (SUTURE) ×3 IMPLANT
SUT PROLENE 6 0 P 1 18 (SUTURE) IMPLANT
SUT SILK 2 0 PERMA HAND 18 BK (SUTURE) IMPLANT
SUT VIC AB 3-0 SH 27 (SUTURE) ×1
SUT VIC AB 3-0 SH 27X BRD (SUTURE) ×2 IMPLANT
SYR BULB 3OZ (MISCELLANEOUS) ×3 IMPLANT
SYR CONTROL 10ML LL (SYRINGE) ×3 IMPLANT
TOWEL GREEN STERILE FF (TOWEL DISPOSABLE) ×3 IMPLANT
TOWEL OR NON WOVEN STRL DISP B (DISPOSABLE) IMPLANT
TUBE CONNECTING 20X1/4 (TUBING) ×3 IMPLANT
YANKAUER SUCT BULB TIP NO VENT (SUCTIONS) ×3 IMPLANT

## 2017-11-16 NOTE — Anesthesia Preprocedure Evaluation (Signed)
Anesthesia Evaluation  Patient identified by MRN, date of birth, ID band Patient awake    Reviewed: Allergy & Precautions, NPO status , Patient's Chart, lab work & pertinent test results  Airway Mallampati: II  TM Distance: >3 FB Neck ROM: Full    Dental no notable dental hx.    Pulmonary neg pulmonary ROS, Current Smoker,    Pulmonary exam normal breath sounds clear to auscultation       Cardiovascular negative cardio ROS Normal cardiovascular exam Rhythm:Regular Rate:Normal     Neuro/Psych negative neurological ROS  negative psych ROS   GI/Hepatic Neg liver ROS, GERD  ,  Endo/Other  Hypothyroidism   Renal/GU negative Renal ROS     Musculoskeletal  (+) Arthritis ,   Abdominal   Peds  Hematology negative hematology ROS (+)   Anesthesia Other Findings   Reproductive/Obstetrics negative OB ROS                             Anesthesia Physical Anesthesia Plan  ASA: II  Anesthesia Plan: General   Post-op Pain Management:    Induction: Intravenous  PONV Risk Score and Plan: 3 and Ondansetron, Dexamethasone and Treatment may vary due to age or medical condition  Airway Management Planned: LMA  Additional Equipment:   Intra-op Plan:   Post-operative Plan: Extubation in OR  Informed Consent: I have reviewed the patients History and Physical, chart, labs and discussed the procedure including the risks, benefits and alternatives for the proposed anesthesia with the patient or authorized representative who has indicated his/her understanding and acceptance.   Dental advisory given  Plan Discussed with: CRNA  Anesthesia Plan Comments:         Anesthesia Quick Evaluation

## 2017-11-16 NOTE — Interval H&P Note (Signed)
History and Physical Interval Note:  11/16/2017 11:30 AM  Jessica Farmer  has presented today for surgery, with the diagnosis of INFECTED SEBACEOUS CYST RIGHT BREAST RIGHT UPPER ABDOMINAL WALL  The various methods of treatment have been discussed with the patient and family. After consideration of risks, benefits and other options for treatment, the patient has consented to  Procedure(s) with comments: EXCISION OF TWO INFECTED SEBACEOUS CYST RIGHT BREAST AND RIGHT UPPER ABDOMINAL WALL (Right) - LMA as a surgical intervention .  The patient's history has been reviewed, patient examined, no change in status, stable for surgery.  I have reviewed the patient's chart and labs.  Questions were answered to the patient's satisfaction.     Wynona Luna

## 2017-11-16 NOTE — Transfer of Care (Signed)
Immediate Anesthesia Transfer of Care Note  Patient: Jessica PoliteWanda G Farmer  Procedure(s) Performed: EXCISION OF TWO INFECTED SEBACEOUS CYST, RIGHT BREAST AND RIGHT UPPER ABDOMINAL WALL (Right Breast)  Patient Location: PACU  Anesthesia Type:General  Level of Consciousness: awake, alert  and oriented  Airway & Oxygen Therapy: Patient Spontanous Breathing and Patient connected to face mask oxygen  Post-op Assessment: Report given to RN and Post -op Vital signs reviewed and stable  Post vital signs: Reviewed and stable  Last Vitals:  Vitals Value Taken Time  BP    Temp    Pulse 103 11/16/2017 12:27 PM  Resp    SpO2 99 % 11/16/2017 12:27 PM  Vitals shown include unvalidated device data.  Last Pain:  Vitals:   11/16/17 1046  TempSrc: Oral  PainSc: 0-No pain         Complications: No apparent anesthesia complications

## 2017-11-16 NOTE — Op Note (Signed)
Preop diagnosis: Infected sebaceous cyst right inframammary crease, sebaceous cyst right upper abdominal wall Postop diagnosis: Same Procedure performed: Incision and drainage of infected sebaceous cyst right inframammary crease, excision of sebaceous cyst right upper abdominal wall Surgeon:Laurita Peron K Idan Prime Anesthesia: General Indications: This is a 62 year old female who presented several weeks ago with a spontaneously ruptured infected sebaceous cyst of the right inframammary crease.  At the time of examination this had healed and there is no sign of infection.  She had another asymptomatic sebaceous cyst on the right upper abdominal wall just below the inframammary crease.  Our plan was to electively excise both of these.  She presented to the office yesterday with a recurrent infection in the right inframammary crease.  We scheduled her for immediate surgery.  Description of procedure: The patient is brought to the operating room and placed in supine position on the operating room table.  After an adequate level of general anesthesia was obtained, we remove the dressing from the right breast.  There is a pinhole in the inframammary crease that is draining purulent fluid.  The sebaceous cyst on the upper abdominal wall does not appear to be infected.  We prepped his entire area with Betadine and draped in sterile fashion.  A timeout was taken to ensure the proper patient and proper procedure.  We infiltrated around each of these areas with 0.25% Marcaine with epinephrine.  I placed a hemostat into the pinhole opening.  The abscess seems to run laterally for distance of about 3 cm.  We made a incision along this entire abscess cavity.  We excised the wall of the abscess back to healthy-appearing fat.  We irrigated this wound thoroughly and used cautery for hemostasis.  1/4 inch Penrose drain was then placed into the cavity.  This was secured with 2-0 nylon.  2-0 nylon was then used to loosely closed the skin  of the incision.  We then excised the sebaceous cyst on the right upper abdominal wall in elliptical fashion.  There was no purulence noted.  We inspected for hemostasis.  The wound was closed with a deep layer of 3-0 Vicryl and a subcuticular layer 4-0 Monocryl.  Dermabond was used to seal this incision.  A dry dressing was placed over the inframammary crease incision to allow drainage.  Patient was extubated and brought to the recovery room in stable condition.  All sponge, instrument, and needle counts are correct.  Wilmon ArmsMatthew K. Corliss Skainssuei, MD, [facility]  General/ Trauma Surgery Beeper [phone]  11/16/2017 12:27 PM

## 2017-11-16 NOTE — Anesthesia Postprocedure Evaluation (Signed)
Anesthesia Post Note  Patient: Jessica Farmer  Procedure(s) Performed: EXCISION OF TWO INFECTED SEBACEOUS CYST, RIGHT BREAST AND RIGHT UPPER ABDOMINAL WALL (Right Breast)     Patient location during evaluation: PACU Anesthesia Type: General Level of consciousness: sedated and patient cooperative Pain management: pain level controlled Vital Signs Assessment: post-procedure vital signs reviewed and stable Respiratory status: spontaneous breathing Cardiovascular status: stable Anesthetic complications: no    Last Vitals:  Vitals:   11/16/17 1315 11/16/17 1335  BP: (!) 108/58 128/66  Pulse: 85 75  Resp: 16 16  Temp:  37.1 C  SpO2: 100% 100%    Last Pain:  Vitals:   11/16/17 1335  TempSrc: Oral  PainSc: 1                  Jessica Farmer

## 2017-11-16 NOTE — Discharge Instructions (Signed)
May take antiinflammatory after 7p tonight if needed.  [facility] Office Phone Number [phone]  POST OP INSTRUCTIONS  Always review your discharge instruction sheet given to you by the facility where your surgery was performed.  IF YOU HAVE DISABILITY OR FAMILY LEAVE FORMS, YOU MUST BRING THEM TO THE OFFICE FOR PROCESSING.  DO NOT GIVE THEM TO YOUR DOCTOR.  1. A prescription for pain medication may be given to you upon discharge.  Take your pain medication as prescribed, if needed.  If narcotic pain medicine is not needed, then you may take acetaminophen (Tylenol) or ibuprofen (Advil) as needed. 2. Take your usually prescribed medications unless otherwise directed 3. If you need a refill on your pain medication, please contact your pharmacy.  They will contact our office to request authorization.  Prescriptions will not be filled after 5pm or on week-ends. 4. You should eat very light the first 24 hours after surgery, such as soup, crackers, pudding, etc.  Resume your normal diet the day after surgery. 5. Keep dry gauze taped over the incision under the breast.  Expect some drainage for several days.  Change the gauze at least twice a day and after showers. 6. It is common to experience some constipation if taking pain medication after surgery.  Increasing fluid intake and taking a stool softener will usually help or prevent this problem from occurring.  A mild laxative (Milk of Magnesia or Miralax) should be taken according to package directions if there are no bowel movements after 48 hours. 7. You may shower over the dressing, then change the dressing after your shower.  8. ACTIVITIES:  You may resume regular daily activities (gradually increasing) beginning the next day.   You may have sexual intercourse when it is comfortable. a. You may drive when you no longer are taking prescription pain medication, you can comfortably wear a seatbelt, and you can safely maneuver your  car and apply brakes. b. RETURN TO WORK:  1-2 weeks 9. You should see your doctor in the office for a follow-up appointment approximately two to three weeks after your surgery.    WHEN TO CALL YOUR DOCTOR: 1. Fever over 101.0 2. Nausea and/or vomiting. 3. Extreme swelling or bruising. 4. Continued bleeding from incision. 5. Increased pain, redness, or drainage from the incision.  The clinic staff is available to answer your questions during regular business hours.  Please dont hesitate to call and ask to speak to one of the nurses for clinical concerns.  If you have a medical emergency, go to the nearest emergency room or call 911.  [facility] is always on call at the hospital.  For further questions, please visit [email or web address]     Post Anesthesia Home Care Instructions  Activity: Get plenty of rest for the remainder of the day. A responsible individual must stay with you for 24 hours following the procedure.  For the next 24 hours, DO NOT: -Drive a car -Advertising copywriterperate machinery -Drink alcoholic beverages -Take any medication unless instructed by your physician -Make any legal decisions or sign important papers.  Meals: Start with liquid foods such as gelatin or soup. Progress to regular foods as tolerated. Avoid greasy, spicy, heavy foods. If nausea and/or vomiting occur, drink only clear liquids until the nausea and/or vomiting subsides. Call your physician if vomiting continues.  Special Instructions/Symptoms: Your throat may feel dry or sore from the anesthesia or the breathing tube placed in your throat during surgery. If this  causes discomfort, gargle with warm salt water. The discomfort should disappear within 24 hours.  If you had a scopolamine patch placed behind your ear for the management of post- operative nausea and/or vomiting:  1. The medication in the patch is effective for 72 hours, after which it should be removed.  Wrap patch  in a tissue and discard in the trash. Wash hands thoroughly with soap and water. 2. You may remove the patch earlier than 72 hours if you experience unpleasant side effects which may include dry mouth, dizziness or visual disturbances. 3. Avoid touching the patch. Wash your hands with soap and water after contact with the patch.

## 2017-11-16 NOTE — Anesthesia Procedure Notes (Signed)
Procedure Name: LMA Insertion Date/Time: 11/16/2017 11:47 AM Performed by: Shanon Payor, CRNA Pre-anesthesia Checklist: Patient identified, Emergency Drugs available, Suction available, Patient being monitored and Timeout performed Patient Re-evaluated:Patient Re-evaluated prior to induction Oxygen Delivery Method: Circle system utilized Preoxygenation: Pre-oxygenation with 100% oxygen Induction Type: IV induction Number of attempts: 1 Placement Confirmation: positive ETCO2 and breath sounds checked- equal and bilateral Tube secured with: Tape Dental Injury: Teeth and Oropharynx as per pre-operative assessment  Comments: Patient edentulous

## 2017-11-17 ENCOUNTER — Encounter (HOSPITAL_BASED_OUTPATIENT_CLINIC_OR_DEPARTMENT_OTHER): Payer: Self-pay | Admitting: Surgery

## 2018-02-11 DIAGNOSIS — E7800 Pure hypercholesterolemia, unspecified: Secondary | ICD-10-CM | POA: Diagnosis not present

## 2018-02-11 DIAGNOSIS — F17210 Nicotine dependence, cigarettes, uncomplicated: Secondary | ICD-10-CM | POA: Diagnosis not present

## 2018-02-11 DIAGNOSIS — E039 Hypothyroidism, unspecified: Secondary | ICD-10-CM | POA: Diagnosis not present

## 2018-03-07 DIAGNOSIS — Z1231 Encounter for screening mammogram for malignant neoplasm of breast: Secondary | ICD-10-CM | POA: Diagnosis not present

## 2018-03-07 DIAGNOSIS — Z803 Family history of malignant neoplasm of breast: Secondary | ICD-10-CM | POA: Diagnosis not present

## 2018-08-23 DIAGNOSIS — K219 Gastro-esophageal reflux disease without esophagitis: Secondary | ICD-10-CM | POA: Diagnosis not present

## 2018-08-23 DIAGNOSIS — E7800 Pure hypercholesterolemia, unspecified: Secondary | ICD-10-CM | POA: Diagnosis not present

## 2018-08-23 DIAGNOSIS — E039 Hypothyroidism, unspecified: Secondary | ICD-10-CM | POA: Diagnosis not present

## 2018-11-03 DIAGNOSIS — E7800 Pure hypercholesterolemia, unspecified: Secondary | ICD-10-CM | POA: Diagnosis not present

## 2018-11-03 DIAGNOSIS — E039 Hypothyroidism, unspecified: Secondary | ICD-10-CM | POA: Diagnosis not present

## 2019-03-06 DIAGNOSIS — M65331 Trigger finger, right middle finger: Secondary | ICD-10-CM | POA: Diagnosis not present

## 2019-03-06 DIAGNOSIS — E7800 Pure hypercholesterolemia, unspecified: Secondary | ICD-10-CM | POA: Diagnosis not present

## 2019-03-06 DIAGNOSIS — E039 Hypothyroidism, unspecified: Secondary | ICD-10-CM | POA: Diagnosis not present

## 2019-03-06 DIAGNOSIS — K219 Gastro-esophageal reflux disease without esophagitis: Secondary | ICD-10-CM | POA: Diagnosis not present

## 2019-03-06 DIAGNOSIS — Z5181 Encounter for therapeutic drug level monitoring: Secondary | ICD-10-CM | POA: Diagnosis not present

## 2019-03-13 DIAGNOSIS — Z1231 Encounter for screening mammogram for malignant neoplasm of breast: Secondary | ICD-10-CM | POA: Diagnosis not present

## 2019-04-10 DIAGNOSIS — M65331 Trigger finger, right middle finger: Secondary | ICD-10-CM | POA: Diagnosis not present

## 2019-05-09 DIAGNOSIS — M65331 Trigger finger, right middle finger: Secondary | ICD-10-CM | POA: Diagnosis not present

## 2019-07-13 ENCOUNTER — Ambulatory Visit (INDEPENDENT_AMBULATORY_CARE_PROVIDER_SITE_OTHER): Payer: BC Managed Care – PPO

## 2019-07-13 ENCOUNTER — Other Ambulatory Visit: Payer: Self-pay | Admitting: Podiatry

## 2019-07-13 ENCOUNTER — Other Ambulatory Visit: Payer: Self-pay

## 2019-07-13 ENCOUNTER — Ambulatory Visit: Payer: BC Managed Care – PPO | Admitting: Podiatry

## 2019-07-13 ENCOUNTER — Encounter: Payer: Self-pay | Admitting: Podiatry

## 2019-07-13 DIAGNOSIS — M7750 Other enthesopathy of unspecified foot: Secondary | ICD-10-CM

## 2019-07-13 DIAGNOSIS — M722 Plantar fascial fibromatosis: Secondary | ICD-10-CM

## 2019-07-13 MED ORDER — MELOXICAM 15 MG PO TABS: 15.0000 mg | ORAL_TABLET | Freq: Every day | ORAL | 3 refills | Status: DC

## 2019-07-13 MED ORDER — METHYLPREDNISOLONE 4 MG PO TBPK: ORAL_TABLET | ORAL | 0 refills | Status: DC

## 2019-07-13 NOTE — Progress Notes (Signed)
She presents today after having not seen her for several years with chief complaint of pain to the bilateral heels.  And a painful lesion in the medial aspect of the arch.  States has been going on now for several months has a history of plantar fasciitis.  She states I feel that the plantar fasciitis is starting to return.  Objective: Vital signs are stable alert oriented x3.  Pulses are palpable.  There is no erythema cellulitis drainage or odor.  She has pain on palpation medial calcaneal tubercle of the bilateral heels and of a 1 cm x 1 cm nonpulsatile nodular mass medial band of plantar fascia just distal to the central arch.  Radiographs taken today demonstrate an osseously mature individual soft tissue increase in density focus calcaneal insertion site indicative plantar fasciitis no calcifications noted within the tendon indicative of any type of dysplastic disease.  Assessment: Plantar fasciitis bilateral.  Plantar fibroma right foot.  Plan: Discussed etiology pathology and surgical therapies at this point time injected 10 mg of Kenalog to the plantar fibroma with local anesthetic I also injected the bilateral heels 20 mg Kenalog 5 mg Marcaine point of maximal tenderness bilaterally.  Plantar fascial braces were provided discussed appropriate shoe gear stretching exercise ice therapy sugar modifications.  Start her on methylprednisolone to be followed by meloxicam.  We will follow-up with her in 1 month

## 2019-08-15 ENCOUNTER — Ambulatory Visit: Payer: BC Managed Care – PPO | Admitting: Podiatry

## 2019-08-15 ENCOUNTER — Other Ambulatory Visit: Payer: Self-pay

## 2019-08-15 DIAGNOSIS — M722 Plantar fascial fibromatosis: Secondary | ICD-10-CM

## 2019-08-15 DIAGNOSIS — R252 Cramp and spasm: Secondary | ICD-10-CM | POA: Diagnosis not present

## 2019-08-15 MED ORDER — CYCLOBENZAPRINE HCL 10 MG PO TABS: 10.0000 mg | ORAL_TABLET | Freq: Every day | ORAL | 3 refills | Status: DC

## 2019-08-16 NOTE — Progress Notes (Signed)
She presents today for follow-up of her plantar fasciitis and plantar fibroma she states that they are doing much better I am noticing some cramping in the feet and legs at night but all in all the fibromas are going down.  Objective: Vital signs are stable alert oriented x3 plantar fibromas have reduced considerably.  No pain on palpation of the heels.  Assessment: Resolving plantar fibromas bilaterally.  Plan: At this point I would recommend cyclobenzaprine 5 mg at nighttime for cramping.  I will follow-up with him on an as-needed basis.

## 2019-10-10 DIAGNOSIS — E039 Hypothyroidism, unspecified: Secondary | ICD-10-CM | POA: Diagnosis not present

## 2019-10-10 DIAGNOSIS — L723 Sebaceous cyst: Secondary | ICD-10-CM | POA: Diagnosis not present

## 2019-10-10 DIAGNOSIS — L089 Local infection of the skin and subcutaneous tissue, unspecified: Secondary | ICD-10-CM | POA: Diagnosis not present

## 2019-10-10 DIAGNOSIS — E7800 Pure hypercholesterolemia, unspecified: Secondary | ICD-10-CM | POA: Diagnosis not present

## 2020-03-18 DIAGNOSIS — Z1231 Encounter for screening mammogram for malignant neoplasm of breast: Secondary | ICD-10-CM | POA: Diagnosis not present

## 2020-04-09 DIAGNOSIS — K219 Gastro-esophageal reflux disease without esophagitis: Secondary | ICD-10-CM | POA: Diagnosis not present

## 2020-04-09 DIAGNOSIS — E7800 Pure hypercholesterolemia, unspecified: Secondary | ICD-10-CM | POA: Diagnosis not present

## 2020-04-09 DIAGNOSIS — E039 Hypothyroidism, unspecified: Secondary | ICD-10-CM | POA: Diagnosis not present

## 2020-09-16 DIAGNOSIS — L821 Other seborrheic keratosis: Secondary | ICD-10-CM | POA: Diagnosis not present

## 2020-10-15 DIAGNOSIS — E039 Hypothyroidism, unspecified: Secondary | ICD-10-CM | POA: Diagnosis not present

## 2020-10-15 DIAGNOSIS — Z23 Encounter for immunization: Secondary | ICD-10-CM | POA: Diagnosis not present

## 2020-10-15 DIAGNOSIS — E7800 Pure hypercholesterolemia, unspecified: Secondary | ICD-10-CM | POA: Diagnosis not present

## 2020-10-15 DIAGNOSIS — K219 Gastro-esophageal reflux disease without esophagitis: Secondary | ICD-10-CM | POA: Diagnosis not present

## 2020-10-28 DIAGNOSIS — L300 Nummular dermatitis: Secondary | ICD-10-CM | POA: Diagnosis not present

## 2020-10-28 DIAGNOSIS — L821 Other seborrheic keratosis: Secondary | ICD-10-CM | POA: Diagnosis not present

## 2021-01-10 DIAGNOSIS — R1314 Dysphagia, pharyngoesophageal phase: Secondary | ICD-10-CM | POA: Diagnosis not present

## 2021-01-10 DIAGNOSIS — K219 Gastro-esophageal reflux disease without esophagitis: Secondary | ICD-10-CM | POA: Diagnosis not present

## 2021-01-10 DIAGNOSIS — Z86010 Personal history of colonic polyps: Secondary | ICD-10-CM | POA: Diagnosis not present

## 2021-03-24 DIAGNOSIS — Z1231 Encounter for screening mammogram for malignant neoplasm of breast: Secondary | ICD-10-CM | POA: Diagnosis not present

## 2021-03-31 DIAGNOSIS — R928 Other abnormal and inconclusive findings on diagnostic imaging of breast: Secondary | ICD-10-CM | POA: Diagnosis not present

## 2021-04-14 DIAGNOSIS — K319 Disease of stomach and duodenum, unspecified: Secondary | ICD-10-CM | POA: Diagnosis not present

## 2021-04-14 DIAGNOSIS — K5730 Diverticulosis of large intestine without perforation or abscess without bleeding: Secondary | ICD-10-CM | POA: Diagnosis not present

## 2021-04-14 DIAGNOSIS — K449 Diaphragmatic hernia without obstruction or gangrene: Secondary | ICD-10-CM | POA: Diagnosis not present

## 2021-04-14 DIAGNOSIS — Z13810 Encounter for screening for upper gastrointestinal disorder: Secondary | ICD-10-CM | POA: Diagnosis not present

## 2021-04-14 DIAGNOSIS — K219 Gastro-esophageal reflux disease without esophagitis: Secondary | ICD-10-CM | POA: Diagnosis not present

## 2021-04-14 DIAGNOSIS — R1310 Dysphagia, unspecified: Secondary | ICD-10-CM | POA: Diagnosis not present

## 2021-04-14 DIAGNOSIS — Z86010 Personal history of colonic polyps: Secondary | ICD-10-CM | POA: Diagnosis not present

## 2021-04-14 DIAGNOSIS — D124 Benign neoplasm of descending colon: Secondary | ICD-10-CM | POA: Diagnosis not present

## 2021-04-14 DIAGNOSIS — K222 Esophageal obstruction: Secondary | ICD-10-CM | POA: Diagnosis not present

## 2021-04-14 DIAGNOSIS — D125 Benign neoplasm of sigmoid colon: Secondary | ICD-10-CM | POA: Diagnosis not present

## 2021-04-21 DIAGNOSIS — K219 Gastro-esophageal reflux disease without esophagitis: Secondary | ICD-10-CM | POA: Diagnosis not present

## 2021-04-21 DIAGNOSIS — F17210 Nicotine dependence, cigarettes, uncomplicated: Secondary | ICD-10-CM | POA: Diagnosis not present

## 2021-04-21 DIAGNOSIS — Z5181 Encounter for therapeutic drug level monitoring: Secondary | ICD-10-CM | POA: Diagnosis not present

## 2021-04-21 DIAGNOSIS — Z23 Encounter for immunization: Secondary | ICD-10-CM | POA: Diagnosis not present

## 2021-04-21 DIAGNOSIS — E039 Hypothyroidism, unspecified: Secondary | ICD-10-CM | POA: Diagnosis not present

## 2021-04-21 DIAGNOSIS — Z0000 Encounter for general adult medical examination without abnormal findings: Secondary | ICD-10-CM | POA: Diagnosis not present

## 2021-04-21 DIAGNOSIS — E7800 Pure hypercholesterolemia, unspecified: Secondary | ICD-10-CM | POA: Diagnosis not present

## 2021-06-19 ENCOUNTER — Ambulatory Visit: Payer: BC Managed Care – PPO | Admitting: Podiatry

## 2021-07-24 ENCOUNTER — Ambulatory Visit: Payer: BC Managed Care – PPO | Admitting: Podiatry

## 2021-07-24 ENCOUNTER — Encounter: Payer: Self-pay | Admitting: Podiatry

## 2021-07-24 DIAGNOSIS — E7800 Pure hypercholesterolemia, unspecified: Secondary | ICD-10-CM | POA: Insufficient documentation

## 2021-07-24 DIAGNOSIS — J309 Allergic rhinitis, unspecified: Secondary | ICD-10-CM | POA: Insufficient documentation

## 2021-07-24 DIAGNOSIS — Z6830 Body mass index (BMI) 30.0-30.9, adult: Secondary | ICD-10-CM | POA: Insufficient documentation

## 2021-07-24 DIAGNOSIS — E039 Hypothyroidism, unspecified: Secondary | ICD-10-CM | POA: Insufficient documentation

## 2021-07-24 DIAGNOSIS — Z860100 Personal history of colon polyps, unspecified: Secondary | ICD-10-CM | POA: Insufficient documentation

## 2021-07-24 DIAGNOSIS — K222 Esophageal obstruction: Secondary | ICD-10-CM | POA: Insufficient documentation

## 2021-07-24 DIAGNOSIS — M722 Plantar fascial fibromatosis: Secondary | ICD-10-CM

## 2021-07-24 DIAGNOSIS — K5730 Diverticulosis of large intestine without perforation or abscess without bleeding: Secondary | ICD-10-CM | POA: Insufficient documentation

## 2021-07-24 DIAGNOSIS — Z720 Tobacco use: Secondary | ICD-10-CM | POA: Insufficient documentation

## 2021-07-24 DIAGNOSIS — R1310 Dysphagia, unspecified: Secondary | ICD-10-CM | POA: Insufficient documentation

## 2021-07-24 DIAGNOSIS — K219 Gastro-esophageal reflux disease without esophagitis: Secondary | ICD-10-CM | POA: Insufficient documentation

## 2021-07-24 DIAGNOSIS — G500 Trigeminal neuralgia: Secondary | ICD-10-CM | POA: Insufficient documentation

## 2021-07-24 MED ORDER — TRIAMCINOLONE ACETONIDE 40 MG/ML IJ SUSP
20.0000 mg | Freq: Once | INTRAMUSCULAR | Status: AC
  Administered 2021-07-24: 20 mg

## 2021-07-24 NOTE — Progress Notes (Signed)
She presents today for follow-up of plantar fibroma to the right foot.  States she just wanted to have it checked because it seems to be getting bigger and is starting to burn a little bit.  Objective: Vital signs are stable she is alert and oriented x3 plantar fibroma measuring about 1-1/2 cm in total diameter along the medial band of the plantar fascia does not feel to be raised from previous evaluation but is a little more tender on palpation.  Assessment: Plantar fibroma right foot.  Plan: I injected the area today with 10 mg Kenalog 5 mg Marcaine point maximal tenderness.  I did discuss with the patient today about skin breakdown and how cortisone will atrophied the skin and cause skin to crack and bleed and what to do if this should occur.  She understands and is amenable to it I will follow-up with her on an as-needed basis.

## 2021-07-24 NOTE — Addendum Note (Signed)
Addended by: Kristian Covey on: 07/24/2021 08:59 AM   Modules accepted: Orders

## 2021-10-07 DIAGNOSIS — Z6829 Body mass index (BMI) 29.0-29.9, adult: Secondary | ICD-10-CM | POA: Diagnosis not present

## 2021-10-07 DIAGNOSIS — M25551 Pain in right hip: Secondary | ICD-10-CM | POA: Diagnosis not present

## 2021-10-07 DIAGNOSIS — L501 Idiopathic urticaria: Secondary | ICD-10-CM | POA: Diagnosis not present

## 2021-10-20 DIAGNOSIS — E7800 Pure hypercholesterolemia, unspecified: Secondary | ICD-10-CM | POA: Diagnosis not present

## 2021-10-20 DIAGNOSIS — F17210 Nicotine dependence, cigarettes, uncomplicated: Secondary | ICD-10-CM | POA: Diagnosis not present

## 2021-10-20 DIAGNOSIS — Z1331 Encounter for screening for depression: Secondary | ICD-10-CM | POA: Diagnosis not present

## 2021-10-20 DIAGNOSIS — E039 Hypothyroidism, unspecified: Secondary | ICD-10-CM | POA: Diagnosis not present

## 2021-10-20 DIAGNOSIS — K219 Gastro-esophageal reflux disease without esophagitis: Secondary | ICD-10-CM | POA: Diagnosis not present

## 2021-10-20 DIAGNOSIS — Z79899 Other long term (current) drug therapy: Secondary | ICD-10-CM | POA: Diagnosis not present

## 2021-10-28 DIAGNOSIS — D72829 Elevated white blood cell count, unspecified: Secondary | ICD-10-CM | POA: Diagnosis not present

## 2021-12-23 DIAGNOSIS — L309 Dermatitis, unspecified: Secondary | ICD-10-CM | POA: Diagnosis not present

## 2022-02-03 ENCOUNTER — Ambulatory Visit: Payer: BC Managed Care – PPO | Admitting: Podiatry

## 2022-02-03 ENCOUNTER — Encounter: Payer: Self-pay | Admitting: Podiatry

## 2022-02-03 VITALS — BP 128/68 | HR 93

## 2022-02-03 DIAGNOSIS — M722 Plantar fascial fibromatosis: Secondary | ICD-10-CM | POA: Diagnosis not present

## 2022-02-03 MED ORDER — TRIAMCINOLONE ACETONIDE 40 MG/ML IJ SUSP
20.0000 mg | Freq: Once | INTRAMUSCULAR | Status: AC
  Administered 2022-02-03: 20 mg

## 2022-02-03 NOTE — Progress Notes (Signed)
She presents today for follow-up of her plantar fibroma on the right foot.  States that has been doing great for a while and then recently started bothering her again.  She states she Amy is a little numbness and tingling on the fourth and fifth toes and on the fifth metatarsal because of the weight that she is having to walk.  Objective: Vital signs stable alert oriented x 3 there is no erythema edema salines drainage odor palpable mass central plantar fascia medial longitudinal arch.  Assessment: Plantar fibroma right.  Plan: At this point I injected the area today with 1/2 cc of 10 mg of Kenalog and Marcaine.  Tolerated procedure well without complications.  Follow-up with me with any skin breakdown or increase in size or discomfort.  We will get her scheduled for orthotics she will have a neutral orthotic built and a cut out in the mid arch area for plantar fibroma which will need to be marked at the time casting.

## 2022-02-16 ENCOUNTER — Ambulatory Visit (INDEPENDENT_AMBULATORY_CARE_PROVIDER_SITE_OTHER): Payer: BC Managed Care – PPO

## 2022-02-16 DIAGNOSIS — M722 Plantar fascial fibromatosis: Secondary | ICD-10-CM | POA: Diagnosis not present

## 2022-02-16 NOTE — Progress Notes (Signed)
Patient presents today to be casted for custom molded orthotics. HYATT is the treating physician.  Impression foam cast was taken. ABN signed.  Patient info-  Shoe size: 7.5 M  Shoe style: ATHLETIC  Height: 5FT 2IN  Weight: 171 LBS  Insurance: [place]   Patient will be notified once orthotics arrive in office and reappoint for fitting at that time.

## 2022-03-09 DIAGNOSIS — G500 Trigeminal neuralgia: Secondary | ICD-10-CM | POA: Diagnosis not present

## 2022-03-09 DIAGNOSIS — Z79899 Other long term (current) drug therapy: Secondary | ICD-10-CM | POA: Diagnosis not present

## 2022-03-27 ENCOUNTER — Other Ambulatory Visit: Payer: Self-pay | Admitting: Family Medicine

## 2022-03-27 DIAGNOSIS — G500 Trigeminal neuralgia: Secondary | ICD-10-CM

## 2022-03-30 DIAGNOSIS — Z1231 Encounter for screening mammogram for malignant neoplasm of breast: Secondary | ICD-10-CM | POA: Diagnosis not present

## 2022-04-03 DIAGNOSIS — R921 Mammographic calcification found on diagnostic imaging of breast: Secondary | ICD-10-CM | POA: Diagnosis not present

## 2022-04-10 DIAGNOSIS — F17210 Nicotine dependence, cigarettes, uncomplicated: Secondary | ICD-10-CM | POA: Diagnosis not present

## 2022-04-10 DIAGNOSIS — G500 Trigeminal neuralgia: Secondary | ICD-10-CM | POA: Diagnosis not present

## 2022-04-15 ENCOUNTER — Other Ambulatory Visit: Payer: Self-pay | Admitting: Radiology

## 2022-04-15 ENCOUNTER — Other Ambulatory Visit: Payer: BC Managed Care – PPO

## 2022-04-15 DIAGNOSIS — N6489 Other specified disorders of breast: Secondary | ICD-10-CM | POA: Diagnosis not present

## 2022-04-15 DIAGNOSIS — R921 Mammographic calcification found on diagnostic imaging of breast: Secondary | ICD-10-CM | POA: Diagnosis not present

## 2022-04-20 ENCOUNTER — Ambulatory Visit
Admission: RE | Admit: 2022-04-20 | Discharge: 2022-04-20 | Disposition: A | Discharge status: Home / Self Care | Payer: BC Managed Care – PPO | Source: Ambulatory Visit | Attending: Family Medicine | Admitting: Family Medicine

## 2022-04-20 ENCOUNTER — Ambulatory Visit (INDEPENDENT_AMBULATORY_CARE_PROVIDER_SITE_OTHER): Payer: BC Managed Care – PPO

## 2022-04-20 DIAGNOSIS — G500 Trigeminal neuralgia: Secondary | ICD-10-CM | POA: Diagnosis not present

## 2022-04-20 DIAGNOSIS — M722 Plantar fascial fibromatosis: Secondary | ICD-10-CM

## 2022-04-20 MED ORDER — GADOPICLENOL 0.5 MMOL/ML IV SOLN
7.5000 mL | Freq: Once | INTRAVENOUS | Status: AC | PRN
  Administered 2022-04-20: 7.5 mL via INTRAVENOUS

## 2022-04-20 NOTE — Progress Notes (Signed)
Patient presents today to pick up custom molded foot orthotics recommended by Dr. HYATT.   Orthotics were dispensed and fit was satisfactory. Reviewed instructions for break-in and wear. Written instructions given to patient.  Patient will follow up as needed.     

## 2022-04-27 DIAGNOSIS — D72829 Elevated white blood cell count, unspecified: Secondary | ICD-10-CM | POA: Diagnosis not present

## 2022-04-27 DIAGNOSIS — E039 Hypothyroidism, unspecified: Secondary | ICD-10-CM | POA: Diagnosis not present

## 2022-04-27 DIAGNOSIS — Z23 Encounter for immunization: Secondary | ICD-10-CM | POA: Diagnosis not present

## 2022-04-27 DIAGNOSIS — E7800 Pure hypercholesterolemia, unspecified: Secondary | ICD-10-CM | POA: Diagnosis not present

## 2022-04-27 DIAGNOSIS — K219 Gastro-esophageal reflux disease without esophagitis: Secondary | ICD-10-CM | POA: Diagnosis not present

## 2022-04-27 DIAGNOSIS — F17210 Nicotine dependence, cigarettes, uncomplicated: Secondary | ICD-10-CM | POA: Diagnosis not present

## 2022-04-27 DIAGNOSIS — Z0000 Encounter for general adult medical examination without abnormal findings: Secondary | ICD-10-CM | POA: Diagnosis not present

## 2022-05-22 DIAGNOSIS — N6315 Unspecified lump in the right breast, overlapping quadrants: Secondary | ICD-10-CM | POA: Diagnosis not present

## 2022-05-22 DIAGNOSIS — E2839 Other primary ovarian failure: Secondary | ICD-10-CM | POA: Diagnosis not present

## 2022-05-22 DIAGNOSIS — R29890 Loss of height: Secondary | ICD-10-CM | POA: Diagnosis not present

## 2022-05-22 DIAGNOSIS — N951 Menopausal and female climacteric states: Secondary | ICD-10-CM | POA: Diagnosis not present

## 2022-05-22 DIAGNOSIS — N6001 Solitary cyst of right breast: Secondary | ICD-10-CM | POA: Diagnosis not present

## 2022-09-16 DIAGNOSIS — H35363 Drusen (degenerative) of macula, bilateral: Secondary | ICD-10-CM | POA: Diagnosis not present

## 2022-10-26 DIAGNOSIS — E7800 Pure hypercholesterolemia, unspecified: Secondary | ICD-10-CM | POA: Diagnosis not present

## 2022-10-26 DIAGNOSIS — D72829 Elevated white blood cell count, unspecified: Secondary | ICD-10-CM | POA: Diagnosis not present

## 2022-10-26 DIAGNOSIS — G500 Trigeminal neuralgia: Secondary | ICD-10-CM | POA: Diagnosis not present

## 2022-10-26 DIAGNOSIS — F17210 Nicotine dependence, cigarettes, uncomplicated: Secondary | ICD-10-CM | POA: Diagnosis not present

## 2022-10-26 DIAGNOSIS — Z23 Encounter for immunization: Secondary | ICD-10-CM | POA: Diagnosis not present

## 2022-10-26 DIAGNOSIS — E039 Hypothyroidism, unspecified: Secondary | ICD-10-CM | POA: Diagnosis not present

## 2022-11-12 ENCOUNTER — Other Ambulatory Visit: Payer: Self-pay

## 2022-11-12 ENCOUNTER — Inpatient Hospital Stay: Payer: BC Managed Care – PPO | Attending: Internal Medicine | Admitting: Internal Medicine

## 2022-11-12 ENCOUNTER — Other Ambulatory Visit: Payer: Self-pay | Admitting: Medical Oncology

## 2022-11-12 ENCOUNTER — Inpatient Hospital Stay: Payer: BC Managed Care – PPO

## 2022-11-12 VITALS — BP 124/71 | HR 74 | Temp 97.4°F | Resp 16 | Ht 63.0 in | Wt 164.6 lb

## 2022-11-12 DIAGNOSIS — Z833 Family history of diabetes mellitus: Secondary | ICD-10-CM | POA: Diagnosis not present

## 2022-11-12 DIAGNOSIS — M1990 Unspecified osteoarthritis, unspecified site: Secondary | ICD-10-CM

## 2022-11-12 DIAGNOSIS — E039 Hypothyroidism, unspecified: Secondary | ICD-10-CM | POA: Insufficient documentation

## 2022-11-12 DIAGNOSIS — R61 Generalized hyperhidrosis: Secondary | ICD-10-CM | POA: Diagnosis not present

## 2022-11-12 DIAGNOSIS — Z90710 Acquired absence of both cervix and uterus: Secondary | ICD-10-CM | POA: Diagnosis not present

## 2022-11-12 DIAGNOSIS — Z8249 Family history of ischemic heart disease and other diseases of the circulatory system: Secondary | ICD-10-CM | POA: Diagnosis not present

## 2022-11-12 DIAGNOSIS — F17210 Nicotine dependence, cigarettes, uncomplicated: Secondary | ICD-10-CM | POA: Insufficient documentation

## 2022-11-12 DIAGNOSIS — C9110 Chronic lymphocytic leukemia of B-cell type not having achieved remission: Secondary | ICD-10-CM

## 2022-11-12 DIAGNOSIS — G500 Trigeminal neuralgia: Secondary | ICD-10-CM | POA: Insufficient documentation

## 2022-11-12 DIAGNOSIS — K219 Gastro-esophageal reflux disease without esophagitis: Secondary | ICD-10-CM | POA: Diagnosis not present

## 2022-11-12 DIAGNOSIS — Z823 Family history of stroke: Secondary | ICD-10-CM | POA: Diagnosis not present

## 2022-11-12 DIAGNOSIS — Z79899 Other long term (current) drug therapy: Secondary | ICD-10-CM | POA: Diagnosis not present

## 2022-11-12 DIAGNOSIS — Z79890 Hormone replacement therapy (postmenopausal): Secondary | ICD-10-CM | POA: Insufficient documentation

## 2022-11-12 DIAGNOSIS — E7800 Pure hypercholesterolemia, unspecified: Secondary | ICD-10-CM | POA: Insufficient documentation

## 2022-11-12 DIAGNOSIS — D72829 Elevated white blood cell count, unspecified: Secondary | ICD-10-CM

## 2022-11-12 DIAGNOSIS — R5383 Other fatigue: Secondary | ICD-10-CM

## 2022-11-12 DIAGNOSIS — R634 Abnormal weight loss: Secondary | ICD-10-CM | POA: Insufficient documentation

## 2022-11-12 DIAGNOSIS — Z831 Family history of other infectious and parasitic diseases: Secondary | ICD-10-CM

## 2022-11-12 LAB — CBC WITH DIFFERENTIAL (CANCER CENTER ONLY)
Abs Immature Granulocytes: 0.04 10*3/uL (ref 0.00–0.07)
Basophils Absolute: 0.1 10*3/uL (ref 0.0–0.1)
Basophils Relative: 0 %
Eosinophils Absolute: 0.1 10*3/uL (ref 0.0–0.5)
Eosinophils Relative: 0 %
HCT: 42.8 % (ref 36.0–46.0)
Hemoglobin: 14 g/dL (ref 12.0–15.0)
Immature Granulocytes: 0 %
Lymphocytes Relative: 66 %
Lymphs Abs: 12.3 10*3/uL — ABNORMAL HIGH (ref 0.7–4.0)
MCH: 29.4 pg (ref 26.0–34.0)
MCHC: 32.7 g/dL (ref 30.0–36.0)
MCV: 89.9 fL (ref 80.0–100.0)
Monocytes Absolute: 0.7 10*3/uL (ref 0.1–1.0)
Monocytes Relative: 4 %
Neutro Abs: 5.6 10*3/uL (ref 1.7–7.7)
Neutrophils Relative %: 30 %
Platelet Count: 168 10*3/uL (ref 150–400)
RBC: 4.76 MIL/uL (ref 3.87–5.11)
RDW: 13 % (ref 11.5–15.5)
Smear Review: NORMAL
WBC Count: 18.8 10*3/uL — ABNORMAL HIGH (ref 4.0–10.5)
nRBC: 0 % (ref 0.0–0.2)

## 2022-11-12 LAB — CMP (CANCER CENTER ONLY)
ALT: 10 U/L (ref 0–44)
AST: 12 U/L — ABNORMAL LOW (ref 15–41)
Albumin: 4.3 g/dL (ref 3.5–5.0)
Alkaline Phosphatase: 57 U/L (ref 38–126)
Anion gap: 4 — ABNORMAL LOW (ref 5–15)
BUN: 13 mg/dL (ref 8–23)
CO2: 31 mmol/L (ref 22–32)
Calcium: 9.7 mg/dL (ref 8.9–10.3)
Chloride: 108 mmol/L (ref 98–111)
Creatinine: 0.88 mg/dL (ref 0.44–1.00)
GFR, Estimated: >60 mL/min (ref >60)
Glucose, Bld: 72 mg/dL (ref 70–99)
Potassium: 4.2 mmol/L (ref 3.5–5.1)
Sodium: 143 mmol/L (ref 135–145)
Total Bilirubin: 0.6 mg/dL (ref <1.2)
Total Protein: 6.6 g/dL (ref 6.5–8.1)

## 2022-11-12 LAB — LACTATE DEHYDROGENASE: LDH: 161 U/L (ref 98–192)

## 2022-11-12 NOTE — Progress Notes (Signed)
[facility] Telephone:([phone]) [phone]   [phone]  CONSULT NOTE  REFERRING PHYSICIAN: Dr. Clayburn Pert Maddan  REASON FOR CONSULTATION:  67 years old white female with persistent lymphocytosis  HPI Jessica Farmer is a 67 y.o. female presented for initial consultation today.Discussed the use of AI scribe software for clinical note transcription with the patient, who gave verbal consent to proceed.  History of Present Illness   The patient, a 67 year old with a history of osteoarthritis, eczema, acid reflux, high cholesterol, hypothyroidism, and trigeminal neuralgia, presents for an initial evaluation due to an elevated white blood cell count, particularly lymphocytes, noticed by her primary care provider. This issue has been ongoing for at least a year, initially identified by a previous doctor who left the practice. The current primary care provider expressed concern due to the duration of the elevated count.  The patient reports experiencing sharp, shooting pains in her gums, which have been partially alleviated by gabapentin. She is scheduled to see a dentist for further evaluation. She also reports easy bruising, unintentional weight loss, and frequent night sweats. The patient has a history of smoking and is aware of the need to quit. She consumes alcohol moderately, with about two drinks per week.  The patient's blood work shows a further increase in white blood cell count, raising concerns about chronic lymphocytic leukemia (CLL). LABS CBC: WBC 14.7, Absolute lymphocyte count 9700, Lymphocytes 66.4%, Hb 14.4, Hct 44.4, PLT 168 (10/26/2022) CBC: WBC 18.8, Absolute lymphocyte count 12300 (11/10/2022) CBC: WBC elevated, Lymphocytes 4300 (03/16/2007)      HPI  Past Medical History:  Diagnosis Date   Arthritis, lumbar spine    Eczema    Esophageal reflux    Hyperlipemia    Hypothyroidism    Infected sebaceous cyst    right breast, right upper abd     Past Surgical History:  Procedure Laterality Date   BREAST CYST EXCISION Right 11/16/2017   Procedure: EXCISION OF TWO INFECTED SEBACEOUS CYST, RIGHT BREAST AND RIGHT UPPER ABDOMINAL WALL;  Surgeon: Manus Rudd, MD;  Location: [facility];  Service: General;  Laterality: Right;  LMA   dental implants Right 04/2014   bottom jaw   VAGINAL HYSTERECTOMY  age 55    Family History  Problem Relation Age of Onset   Other Mother        MRSA   Hypertension Father    Diabetes Sister    Hypertension Sister    Other Sister         trigeminal neuralgia   Diabetes Brother    Stroke Brother        x 2    Social History Social History   Tobacco Use   Smoking status: Every Day    Current packs/day: 0.50    Average packs/day: 0.5 packs/day for 41.0 years (20.5 ttl pk-yrs)    Types: Cigarettes   Smokeless tobacco: Never  Substance Use Topics   Alcohol use: Yes    Comment: twice a week   Drug use: No    No Known Allergies  Current Outpatient Medications  Medication Sig Dispense Refill   clotrimazole (LOTRIMIN) 1 % cream Apply topically.     CRESTOR 10 MG tablet Take 1 tablet by mouth daily.     esomeprazole (NEXIUM) 40 MG capsule Take 1 capsule by mouth daily.     levothyroxine (SYNTHROID) 100 MCG tablet Take 100 mcg by mouth every morning.     Multiple Vitamin (MULTIVITAMIN WITH MINERALS)  TABS tablet Take 1 tablet by mouth daily.     valACYclovir (VALTREX) 500 MG tablet Take by mouth.     No current facility-administered medications for this visit.    Review of Systems  Constitutional: positive for fatigue, night sweats, and weight loss Eyes: negative Ears, nose, mouth, throat, and face: negative Respiratory: negative Cardiovascular: negative Gastrointestinal: negative Genitourinary:negative Integument/breast: negative Hematologic/lymphatic: negative Musculoskeletal:negative Neurological: negative Behavioral/Psych: negative Endocrine:  negative Allergic/Immunologic: negative  Physical Exam  ZOX:WRUEA, healthy, no distress, well nourished, well developed, and anxious SKIN: skin color, texture, turgor are normal, no rashes or significant lesions HEAD: Normocephalic, No masses, lesions, tenderness or abnormalities EYES: normal, PERRLA, Conjunctiva are pink and non-injected EARS: External ears normal, Canals clear OROPHARYNX:no exudate, no erythema, and lips, buccal mucosa, and tongue normal  NECK: supple, no adenopathy, no JVD LYMPH:  no palpable lymphadenopathy, no hepatosplenomegaly BREAST:not examined LUNGS: clear to auscultation , and palpation HEART: regular rate & rhythm, no murmurs, and no gallops ABDOMEN:abdomen soft, non-tender, normal bowel sounds, and no masses or organomegaly BACK: Back symmetric, no curvature., No CVA tenderness EXTREMITIES:no joint deformities, effusion, or inflammation, no edema  NEURO: alert & oriented x 3 with fluent speech, no focal motor/sensory deficits  PERFORMANCE STATUS: ECOG 1  LABORATORY DATA: Lab Results  Component Value Date   WBC 18.8 (H) 11/12/2022   HGB 14.0 11/12/2022   HCT 42.8 11/12/2022   MCV 89.9 11/12/2022   PLT 168 11/12/2022      Chemistry      Component Value Date/Time   NA 143 11/12/2022 1133   K 4.2 11/12/2022 1133   CL 108 11/12/2022 1133   CO2 31 11/12/2022 1133   BUN 13 11/12/2022 1133   CREATININE 0.88 11/12/2022 1133      Component Value Date/Time   CALCIUM 9.7 11/12/2022 1133   ALKPHOS 57 11/12/2022 1133   AST 12 (L) 11/12/2022 1133   ALT 10 11/12/2022 1133   BILITOT 0.6 11/12/2022 1133       RADIOGRAPHIC STUDIES: No results found.  ASSESSMENT AND PLAN: This is a very pleasant 67 years old white female with persistent and chronic lymphocytosis highly suspicious for chronic lymphocytic leukemia stage 0. Assessment and Plan    Chronic Lymphocytic Leukemia (CLL) Persistent lymphocytosis for over a year with recent increase in total  white blood count to 18.8 and absolute lymphocyte count to 12,300. Morphology of white blood cells showed smudge cells. No symptoms of lymphadenopathy, anemia, thrombocytopenia, or hepatosplenomegaly. -Order flow cytometry of peripheral blood to confirm diagnosis and CLL FISH panel. -Follow-up in 3 months to monitor progression.  Trigeminal Neuralgia Reports of sharp, shooting pains in gums. Currently on Gabapentin with partial relief. -Continue Gabapentin as prescribed. -Follow-up with dentist for further evaluation.  Bruising Reports of easy bruising on arms. -Monitor for progression or associated symptoms.  Night Sweats Reports of consistent night sweats. -Monitor for progression or associated symptoms.  Smoking Current smoker. -Encouraged smoking cessation for overall health improvement.     The patient voices understanding of current disease status and treatment options and is in agreement with the current care plan.  All questions were answered. The patient knows to call the clinic with any problems, questions or concerns. We can certainly see the patient much sooner if necessary.  Thank you so much for allowing me to participate in the care of Jessica Farmer. I will continue to follow up the patient with you and assist in her care.  The total time spent  in the appointment was 60 minutes.  Disclaimer: This note was dictated with voice recognition software. Similar sounding words can inadvertently be transcribed and may not be corrected upon review.   Lajuana Matte November 12, 2022, 12:15 PM

## 2022-11-13 LAB — SURGICAL PATHOLOGY

## 2022-11-16 LAB — FLOW CYTOMETRY

## 2022-11-19 LAB — FISH,CLL PROGNOSTIC PANEL

## 2023-01-21 ENCOUNTER — Telehealth: Payer: Self-pay | Admitting: Medical Oncology

## 2023-01-21 NOTE — Telephone Encounter (Signed)
Case manager asked for recent progress note. Faxed.

## 2023-02-11 ENCOUNTER — Inpatient Hospital Stay: Payer: BC Managed Care – PPO | Admitting: Internal Medicine

## 2023-02-11 ENCOUNTER — Inpatient Hospital Stay: Payer: BC Managed Care – PPO | Attending: Internal Medicine

## 2023-02-11 VITALS — BP 114/56 | HR 75 | Temp 97.3°F | Resp 18 | Ht 63.0 in | Wt 164.7 lb

## 2023-02-11 DIAGNOSIS — R5383 Other fatigue: Secondary | ICD-10-CM | POA: Diagnosis not present

## 2023-02-11 DIAGNOSIS — C9110 Chronic lymphocytic leukemia of B-cell type not having achieved remission: Secondary | ICD-10-CM | POA: Diagnosis not present

## 2023-02-11 DIAGNOSIS — Z79899 Other long term (current) drug therapy: Secondary | ICD-10-CM | POA: Diagnosis not present

## 2023-02-11 DIAGNOSIS — Z90710 Acquired absence of both cervix and uterus: Secondary | ICD-10-CM | POA: Insufficient documentation

## 2023-02-11 LAB — CMP (CANCER CENTER ONLY)
ALT: 14 U/L (ref 0–44)
AST: 13 U/L — ABNORMAL LOW (ref 15–41)
Albumin: 4.7 g/dL (ref 3.5–5.0)
Alkaline Phosphatase: 65 U/L (ref 38–126)
Anion gap: 5 (ref 5–15)
BUN: 13 mg/dL (ref 8–23)
CO2: 31 mmol/L (ref 22–32)
Calcium: 9.6 mg/dL (ref 8.9–10.3)
Chloride: 108 mmol/L (ref 98–111)
Creatinine: 0.8 mg/dL (ref 0.44–1.00)
GFR, Estimated: >60 mL/min (ref >60)
Glucose, Bld: 94 mg/dL (ref 70–99)
Potassium: 3.7 mmol/L (ref 3.5–5.1)
Sodium: 144 mmol/L (ref 135–145)
Total Bilirubin: 0.4 mg/dL (ref 0.0–1.2)
Total Protein: 7.2 g/dL (ref 6.5–8.1)

## 2023-02-11 LAB — CBC WITH DIFFERENTIAL (CANCER CENTER ONLY)
Abs Immature Granulocytes: 0.04 10*3/uL (ref 0.00–0.07)
Basophils Absolute: 0 10*3/uL (ref 0.0–0.1)
Basophils Relative: 0 %
Eosinophils Absolute: 0.1 10*3/uL (ref 0.0–0.5)
Eosinophils Relative: 0 %
HCT: 46.8 % — ABNORMAL HIGH (ref 36.0–46.0)
Hemoglobin: 15.2 g/dL — ABNORMAL HIGH (ref 12.0–15.0)
Immature Granulocytes: 0 %
Lymphocytes Relative: 69 %
Lymphs Abs: 14.3 10*3/uL — ABNORMAL HIGH (ref 0.7–4.0)
MCH: 29.5 pg (ref 26.0–34.0)
MCHC: 32.5 g/dL (ref 30.0–36.0)
MCV: 90.7 fL (ref 80.0–100.0)
Monocytes Absolute: 0.7 10*3/uL (ref 0.1–1.0)
Monocytes Relative: 3 %
Neutro Abs: 6 10*3/uL (ref 1.7–7.7)
Neutrophils Relative %: 28 %
Platelet Count: 179 10*3/uL (ref 150–400)
RBC: 5.16 MIL/uL — ABNORMAL HIGH (ref 3.87–5.11)
RDW: 12.9 % (ref 11.5–15.5)
Smear Review: NORMAL
WBC Count: 21.1 10*3/uL — ABNORMAL HIGH (ref 4.0–10.5)
nRBC: 0 % (ref 0.0–0.2)

## 2023-02-11 LAB — LACTATE DEHYDROGENASE: LDH: 159 U/L (ref 98–192)

## 2023-02-11 NOTE — Progress Notes (Signed)
 [facility] [phone]   Fax:([phone]  OFFICE PROGRESS NOTE  Rankins, Jessica SAUNDERS, MD [address] [place] [address]  DIAGNOSIS: Chronic lymphocytic leukemia stage 0.   FISH study for CLL prognosis showed:   1. No evidence of trisomy 12 ( + 12) .  2. 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 3) are detected.  Comment: Deletions involving 13q14 are detectable by FISH in approximately 50% of persons with CLL. Among them, about 25% show biallelic or concomitant monoallelic and biallelic deletion. As a group and as the sole aberration, del( 13q) is associated with a more favorable outcome.  3. No evidence of p53 ( 17p13) deletion or amplification.  4. No evidence of ATM ( 11q22. 3) deletion.  PRIOR THERAPY: None  CURRENT THERAPY: Observation  INTERVAL HISTORY: Jessica Farmer 68 y.o. female returns to the clinic today for follow-up visit.Discussed the use of AI scribe software for clinical note transcription with the patient, who gave verbal consent to proceed.  History of Present Illness   Jessica Farmer is a 68 year old female with chronic lymphocytic leukemia (CLL) who presents for follow-up of her condition.  She has been diagnosed with chronic lymphocytic leukemia (CLL) and has been under observation for the past three months. During this period, she has experienced mild fatigue and increased frustration and irritability, which she describes as a change in her temper. No recent night sweats or significant unintentional weight loss have been noted. Her weight has remained stable since her last visit in November, with a slight decrease from 165 pounds to 164.7 pounds.  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 Her white blood cell count has increased from 18.8 in November to 21.1. She has not experienced any symptoms  of anemia, as her hemoglobin and hematocrit levels remain stable. No swollen glands have been reported.       MEDICAL HISTORY: Past Medical History:  Diagnosis Date   Arthritis, lumbar spine    Eczema    Esophageal reflux    Hyperlipemia    Hypothyroidism    Infected sebaceous cyst    right breast, right upper abd    ALLERGIES:  has no known allergies.  MEDICATIONS:  Current Outpatient Medications  Medication Sig Dispense Refill   clotrimazole (LOTRIMIN) 1 % cream Apply topically.     CRESTOR 10 MG tablet Take 1 tablet by mouth daily.     esomeprazole (NEXIUM) 40 MG capsule Take 1 capsule by mouth daily.     levothyroxine (SYNTHROID) 100 MCG tablet Take 100 mcg by mouth every morning.     Multiple Vitamin (MULTIVITAMIN WITH MINERALS) TABS tablet Take 1 tablet by mouth daily.     valACYclovir (VALTREX) 500 MG tablet Take by mouth.     No current facility-administered medications for this visit.    SURGICAL HISTORY:  Past Surgical History:  Procedure Laterality Date   BREAST CYST EXCISION Right 11/16/2017   Procedure: EXCISION OF TWO INFECTED SEBACEOUS CYST, RIGHT BREAST AND RIGHT UPPER ABDOMINAL WALL;  Surgeon: Belinda Cough, MD;  Location: [facility];  Service: General;  Laterality: Right;  LMA   dental implants Right 04/2014   bottom jaw   VAGINAL HYSTERECTOMY  age 78    REVIEW OF SYSTEMS:  A comprehensive review of systems was negative except  for: Constitutional: positive for fatigue   PHYSICAL EXAMINATION: General appearance: alert, cooperative, and no distress Head: Normocephalic, without obvious abnormality, atraumatic Neck: no adenopathy, no JVD, supple, symmetrical, trachea midline, and thyroid  not enlarged, symmetric, no tenderness/mass/nodules Lymph nodes: Cervical, supraclavicular, and axillary nodes normal. Resp: clear to auscultation bilaterally Back: symmetric, no curvature. ROM normal. No CVA tenderness. Cardio: regular rate and rhythm,  S1, S2 normal, no murmur, click, rub or gallop GI: soft, non-tender; bowel sounds normal; no masses,  no organomegaly Extremities: extremities normal, atraumatic, no cyanosis or edema  ECOG PERFORMANCE STATUS: 1 - Symptomatic but completely ambulatory  There were no vitals taken for this visit.  LABORATORY DATA: Lab Results  Component Value Date   WBC 18.8 (H) 11/12/2022   HGB 14.0 11/12/2022   HCT 42.8 11/12/2022   MCV 89.9 11/12/2022   PLT 168 11/12/2022      Chemistry      Component Value Date/Time   NA 143 11/12/2022 1133   K 4.2 11/12/2022 1133   CL 108 11/12/2022 1133   CO2 31 11/12/2022 1133   BUN 13 11/12/2022 1133   CREATININE 0.88 11/12/2022 1133      Component Value Date/Time   CALCIUM 9.7 11/12/2022 1133   ALKPHOS 57 11/12/2022 1133   AST 12 (L) 11/12/2022 1133   ALT 10 11/12/2022 1133   BILITOT 0.6 11/12/2022 1133       RADIOGRAPHIC STUDIES: No results found.  ASSESSMENT AND PLAN: This is a very pleasant 68 years old white female recently diagnosed with chronic lymphocytic leukemia in November 2024.  She is currently on observation and feeling fine. Prognostic studies by FISH showed del 13 abnormality which is usually consistent with good prognosis.    Chronic Lymphocytic Leukemia (CLL) Diagnosed three months ago with elevated WBC and lymphocyte count. Recent FISH study shows deletion of chromosome 13, indicating a favorable prognosis. Current WBC count is 21.1, up from 18.8 in November, which is expected in CLL. Hemoglobin, hematocrit, and platelet counts are within normal range. No signs of anemia or thrombocytopenia. Reports mild fatigue and increased irritability but no night sweats or significant weight loss. No treatment needed unless significant changes occur; WBC count can increase without immediate concern. - Continue observation - Monitor for new symptoms such as lymphadenopathy - Schedule follow-up in six months - Advise to report any new  symptoms.   The patient was advised to call immediately if she has any concerning symptoms in the interval. The patient voices understanding of current disease status and treatment options and is in agreement with the current care plan.  All questions were answered. The patient knows to call the clinic with any problems, questions or concerns. We can certainly see the patient much sooner if necessary.  The total time spent in the appointment was 20 minutes.  Disclaimer: This note was dictated with voice recognition software. Similar sounding words can inadvertently be transcribed and may not be corrected upon review.

## 2023-03-30 ENCOUNTER — Ambulatory Visit: Admitting: Podiatry

## 2023-04-05 ENCOUNTER — Ambulatory Visit: Admitting: Diagnostic Neuroimaging

## 2023-04-05 ENCOUNTER — Encounter: Payer: Self-pay | Admitting: Diagnostic Neuroimaging

## 2023-04-05 VITALS — BP 145/76 | HR 84 | Ht 63.0 in | Wt 167.0 lb

## 2023-04-05 DIAGNOSIS — Z1231 Encounter for screening mammogram for malignant neoplasm of breast: Secondary | ICD-10-CM | POA: Diagnosis not present

## 2023-04-05 DIAGNOSIS — G500 Trigeminal neuralgia: Secondary | ICD-10-CM | POA: Diagnosis not present

## 2023-04-05 MED ORDER — GABAPENTIN 100 MG PO CAPS: 100.0000 mg | ORAL_CAPSULE | Freq: Three times a day (TID) | ORAL | Status: AC

## 2023-04-05 NOTE — Patient Instructions (Signed)
  RIGHT TRIGEMINAL NEURALGIA - gradually increase gabapentin up to 300mg  three times a day  - refer to Dr. Angelyn Punt for gamma-knife evaluation vs microvascular decompression

## 2023-04-05 NOTE — Progress Notes (Signed)
 [facility]  PATIENT: Jessica Farmer DOB: [birth year]  REFERRING CLINICIAN: Orpha Bur, MD  HISTORY FROM: patient REASON FOR VISIT: new consult   HISTORICAL  CHIEF COMPLAINT:  Chief Complaint  Patient presents with   Trigeminal Neuralgia    Rm 7 alone Pt is well, reports neuralgia has worsen sicne last visit. She is having sever R sided burning, shooting pain and eye twitching.     HISTORY OF PRESENT ILLNESS:   UPDATE (04/05/23, VRP): 68 year old female with right trigeminal neuralgia. Since last visit, doing well and symptoms resolved until ~ 2022; then intermittent recurrence. Now more severe. Now on gabapentin 100mg  4x per day with mild benefit.   UPDATE 10/02/14: Since last visit, tried CBZ which helped a little bit. Took until June 2015, then stopped due to diff in chewing. Then had lower dental implants for denture attachment in April 2016.   PRIOR HPI (04/21/13): 68 year old right-handed female here for evaluation of right facial pain. For past 2 years patient has had intermittent shooting, sharp electrical pain in her right face. Symptoms radiate to her right lower lip. She has some numbness and tingling in the bottom lip on the right eye. Talking, eating, chewing sometimes triggers electrical burning zap sensations. Sometimes she has throbbing pain. Symptoms are worse in the wintertime. They affect her about 70% the day. She's tried Tylenol PM and hydrocodone without relief.  Also of note patient had poor dental hygiene as a child, lost family dental insurance, and ultimately lost her teeth at age 57 years old with complete dentures. 3 years ago her dentures were replaced in attempt to help these facial pain, but no change in sxs.   REVIEW OF SYSTEMS: Full 14 system review of systems performed and notable only for heat intol fatigue blurred vision.    ALLERGIES: No Known Allergies   HOME MEDICATIONS: Outpatient Medications Prior to Visit   Medication Sig Dispense Refill   Calcium Carbonate (CALCIUM 500 PO) Take 500 mg by mouth daily.     CRESTOR 10 MG tablet Take 1 tablet by mouth daily.     esomeprazole (NEXIUM) 40 MG capsule Take 1 capsule by mouth daily.     levothyroxine (SYNTHROID) 100 MCG tablet Take 100 mcg by mouth every morning.     Multiple Vitamin (MULTIVITAMIN WITH MINERALS) TABS tablet Take 1 tablet by mouth daily.     rosuvastatin (CRESTOR) 10 MG tablet Take 10 mg by mouth daily.     valACYclovir (VALTREX) 500 MG tablet Take by mouth.     gabapentin (NEURONTIN) 100 MG capsule Take 100 mg by mouth 4 (four) times daily.     clotrimazole (LOTRIMIN) 1 % cream Apply topically.     No facility-administered medications prior to visit.    PAST MEDICAL HISTORY: Past Medical History:  Diagnosis Date   Arthritis, lumbar spine    Eczema    Esophageal reflux    Hyperlipemia    Hypothyroidism    Infected sebaceous cyst    right breast, right upper abd    PAST SURGICAL HISTORY: Past Surgical History:  Procedure Laterality Date   BREAST CYST EXCISION Right 11/16/2017   Procedure: EXCISION OF TWO INFECTED SEBACEOUS CYST, RIGHT BREAST AND RIGHT UPPER ABDOMINAL WALL;  Surgeon: Manus Rudd, MD;  Location: [facility];  Service: General;  Laterality: Right;  LMA   dental implants Right 04/2014   bottom jaw   VAGINAL HYSTERECTOMY  age 13    FAMILY HISTORY:  Family History  Problem Relation Age of Onset   Other Mother        MRSA   Hypertension Father    Diabetes Sister    Hypertension Sister    Other Sister         trigeminal neuralgia   Diabetes Brother    Stroke Brother        x 2    SOCIAL HISTORY:  Social History   Socioeconomic History   Marital status: Married    Spouse name: Kathlene November   Number of children: 2   Years of education: 12th   Highest education level: Not on file  Occupational History    Employer: [employer]  Tobacco Use   Smoking status: Every Day    Current  packs/day: 0.50    Average packs/day: 0.5 packs/day for 41.0 years (20.5 ttl pk-yrs)    Types: Cigarettes   Smokeless tobacco: Never  Substance and Sexual Activity   Alcohol use: Yes    Comment: twice a week   Drug use: No   Sexual activity: Not on file  Other Topics Concern   Not on file  Social History Narrative   Patient lives at home with spouse.   Caffeine Use: 3 cups daily   Social Drivers of Corporate investment banker Strain: Not on file  Food Insecurity: Not on file  Transportation Needs: Not on file  Physical Activity: Not on file  Stress: Not on file  Social Connections: Not on file  Intimate Partner Violence: Not on file     PHYSICAL EXAM  Vitals:   04/05/23 1526  BP: (!) 145/76  Pulse: 84  Weight: 167 lb (75.8 kg)  Height: 5\' 3"  (1.6 m)   Not recorded     Body mass index is 29.58 kg/m.  GENERAL EXAM: Patient is in no distress; well developed, nourished and groomed; neck is supple  CARDIOVASCULAR: Regular rate and rhythm, no murmurs, no carotid bruits  NEUROLOGIC: MENTAL STATUS: awake, alert, language fluent, comprehension intact, naming intact, fund of knowledge appropriate CRANIAL NERVE: pupils equal and reactive to light, visual fields full to confrontation, extraocular muscles intact, no nystagmus, facial sensation and strength symmetric, hearing intact, palate elevates symmetrically, uvula midline, shoulder shrug symmetric, tongue midline. MOTOR: normal bulk and tone, full strength in the BUE, BLE SENSORY: normal and symmetric to light touch COORDINATION: finger-nose-finger, fine finger movementsnormal REFLEXES: deep tendon reflexes present and symmetric GAIT/STATION: narrow based gait; able to walk tandem; romberg is negative    DIAGNOSTIC DATA (LABS, IMAGING, TESTING) - I reviewed patient records, labs, notes, testing and imaging myself where available.  Lab Results  Component Value Date   WBC 21.1 (H) 02/11/2023   HGB 15.2 (H)  02/11/2023   HCT 46.8 (H) 02/11/2023   MCV 90.7 02/11/2023   PLT 179 02/11/2023      Component Value Date/Time   NA 144 02/11/2023 1306   K 3.7 02/11/2023 1306   CL 108 02/11/2023 1306   CO2 31 02/11/2023 1306   GLUCOSE 94 02/11/2023 1306   BUN 13 02/11/2023 1306   CREATININE 0.80 02/11/2023 1306   CALCIUM 9.6 02/11/2023 1306   PROT 7.2 02/11/2023 1306   ALBUMIN 4.7 02/11/2023 1306   AST 13 (L) 02/11/2023 1306   ALT 14 02/11/2023 1306   ALKPHOS 65 02/11/2023 1306   BILITOT 0.4 02/11/2023 1306   GFRNONAA >60 02/11/2023 1306   GFRAA  03/16/2007 1636    >60  The eGFR has been calculated using the MDRD equation. This calculation has not been validated in all clinical   No results found for: "CHOL", "HDL", "LDLCALC", "LDLDIRECT", "TRIG", "CHOLHDL" No results found for: "HGBA1C" No results found for: "VITAMINB12" No results found for: "TSH"  07/08/11 MRI brain and CN5 (with and without) - small vessel loop slightly abutting the medial aspect of right trigeminal nerve cisternal segment (series 9, images 31-37). Otherwise normal.  04/20/22 MRI brain  - stable compared to 2013    ASSESSMENT AND PLAN  68 y.o. year old female here with right trigeminal neuralgia, may be secondary to small blood vessel loop near the right trigeminal nerve cisternal segment, which is seen on MRI from 2013.   PLAN:  RIGHT TRIGEMINAL NEURALGIA - gradually increase gabapentin up to 300mg  three times a day - refer to Dr. Angelyn Punt for gamma-knife evaluation vs microvascular decompression  Meds ordered this encounter  Medications   gabapentin (NEURONTIN) 100 MG capsule    Sig: Take 1-3 capsules (100-300 mg total) by mouth 3 (three) times daily.   Orders Placed This Encounter  Procedures   Ambulatory referral to Neurosurgery   Return in about 6 months (around 10/05/2023) for MyChart visit (15 min).    Suanne Marker, MD 04/05/2023, 3:51 PM Certified in Neurology, Neurophysiology and  Neuroimaging  [facility] [address] [place], [place] [address] [phone]

## 2023-04-06 ENCOUNTER — Telehealth: Payer: Self-pay

## 2023-04-06 NOTE — Telephone Encounter (Signed)
 Urgent referral sent to Dr Angelyn Punt at [facility]. (P) [phone] (F) [phone]

## 2023-05-03 ENCOUNTER — Other Ambulatory Visit: Payer: Self-pay | Admitting: Family Medicine

## 2023-05-03 DIAGNOSIS — E7800 Pure hypercholesterolemia, unspecified: Secondary | ICD-10-CM | POA: Diagnosis not present

## 2023-05-03 DIAGNOSIS — F17210 Nicotine dependence, cigarettes, uncomplicated: Secondary | ICD-10-CM

## 2023-05-03 DIAGNOSIS — C9110 Chronic lymphocytic leukemia of B-cell type not having achieved remission: Secondary | ICD-10-CM | POA: Diagnosis not present

## 2023-05-03 DIAGNOSIS — G500 Trigeminal neuralgia: Secondary | ICD-10-CM | POA: Diagnosis not present

## 2023-05-03 DIAGNOSIS — Z23 Encounter for immunization: Secondary | ICD-10-CM | POA: Diagnosis not present

## 2023-05-03 DIAGNOSIS — Z0000 Encounter for general adult medical examination without abnormal findings: Secondary | ICD-10-CM | POA: Diagnosis not present

## 2023-05-03 DIAGNOSIS — E039 Hypothyroidism, unspecified: Secondary | ICD-10-CM | POA: Diagnosis not present

## 2023-05-11 DIAGNOSIS — G500 Trigeminal neuralgia: Secondary | ICD-10-CM | POA: Diagnosis not present

## 2023-05-11 DIAGNOSIS — C9110 Chronic lymphocytic leukemia of B-cell type not having achieved remission: Secondary | ICD-10-CM | POA: Diagnosis not present

## 2023-05-18 ENCOUNTER — Ambulatory Visit
Admission: RE | Admit: 2023-05-18 | Discharge: 2023-05-18 | Discharge status: Home / Self Care | Source: Ambulatory Visit | Attending: Family Medicine | Admitting: Family Medicine

## 2023-05-18 DIAGNOSIS — F17210 Nicotine dependence, cigarettes, uncomplicated: Secondary | ICD-10-CM

## 2023-05-18 DIAGNOSIS — Z122 Encounter for screening for malignant neoplasm of respiratory organs: Secondary | ICD-10-CM | POA: Diagnosis not present

## 2023-07-08 DIAGNOSIS — Z510 Encounter for antineoplastic radiation therapy: Secondary | ICD-10-CM | POA: Diagnosis not present

## 2023-07-08 DIAGNOSIS — G500 Trigeminal neuralgia: Secondary | ICD-10-CM | POA: Diagnosis not present

## 2023-08-12 ENCOUNTER — Inpatient Hospital Stay: Payer: BC Managed Care – PPO | Attending: Internal Medicine

## 2023-08-12 ENCOUNTER — Inpatient Hospital Stay: Payer: BC Managed Care – PPO | Admitting: Internal Medicine

## 2023-08-12 VITALS — BP 117/69 | HR 86 | Temp 98.1°F | Resp 17 | Ht 63.0 in | Wt 168.0 lb

## 2023-08-12 DIAGNOSIS — C9110 Chronic lymphocytic leukemia of B-cell type not having achieved remission: Secondary | ICD-10-CM | POA: Insufficient documentation

## 2023-08-12 DIAGNOSIS — Z79899 Other long term (current) drug therapy: Secondary | ICD-10-CM | POA: Insufficient documentation

## 2023-08-12 DIAGNOSIS — Z90710 Acquired absence of both cervix and uterus: Secondary | ICD-10-CM | POA: Diagnosis not present

## 2023-08-12 DIAGNOSIS — R61 Generalized hyperhidrosis: Secondary | ICD-10-CM | POA: Insufficient documentation

## 2023-08-12 LAB — CBC WITH DIFFERENTIAL (CANCER CENTER ONLY)
Abs Immature Granulocytes: 0.05 K/uL (ref 0.00–0.07)
Basophils Absolute: 0 K/uL (ref 0.0–0.1)
Basophils Relative: 0 %
Eosinophils Absolute: 0.1 K/uL (ref 0.0–0.5)
Eosinophils Relative: 0 %
HCT: 43.3 % (ref 36.0–46.0)
Hemoglobin: 14.1 g/dL (ref 12.0–15.0)
Immature Granulocytes: 0 %
Lymphocytes Relative: 60 %
Lymphs Abs: 9.7 K/uL — ABNORMAL HIGH (ref 0.7–4.0)
MCH: 28.7 pg (ref 26.0–34.0)
MCHC: 32.6 g/dL (ref 30.0–36.0)
MCV: 88 fL (ref 80.0–100.0)
Monocytes Absolute: 0.6 K/uL (ref 0.1–1.0)
Monocytes Relative: 4 %
Neutro Abs: 6 K/uL (ref 1.7–7.7)
Neutrophils Relative %: 36 %
Platelet Count: 159 K/uL (ref 150–400)
RBC: 4.92 MIL/uL (ref 3.87–5.11)
RDW: 12.9 % (ref 11.5–15.5)
Smear Review: NORMAL
WBC Count: 16.5 K/uL — ABNORMAL HIGH (ref 4.0–10.5)
nRBC: 0 % (ref 0.0–0.2)

## 2023-08-12 LAB — CMP (CANCER CENTER ONLY)
ALT: 13 U/L (ref 0–44)
AST: 13 U/L — ABNORMAL LOW (ref 15–41)
Albumin: 4.4 g/dL (ref 3.5–5.0)
Alkaline Phosphatase: 63 U/L (ref 38–126)
Anion gap: 6 (ref 5–15)
BUN: 12 mg/dL (ref 8–23)
CO2: 30 mmol/L (ref 22–32)
Calcium: 9.3 mg/dL (ref 8.9–10.3)
Chloride: 107 mmol/L (ref 98–111)
Creatinine: 0.81 mg/dL (ref 0.44–1.00)
GFR, Estimated: >60 mL/min (ref >60)
Glucose, Bld: 73 mg/dL (ref 70–99)
Potassium: 4.1 mmol/L (ref 3.5–5.1)
Sodium: 143 mmol/L (ref 135–145)
Total Bilirubin: 0.4 mg/dL (ref 0.0–1.2)
Total Protein: 6.7 g/dL (ref 6.5–8.1)

## 2023-08-12 LAB — LACTATE DEHYDROGENASE: LDH: 153 U/L (ref 98–192)

## 2023-08-12 NOTE — Progress Notes (Signed)
 [facility] [phone]   Fax:(336) [phone]  OFFICE PROGRESS NOTE  Jessica Artist PARAS, MD [address] [place] [address]  DIAGNOSIS: Chronic lymphocytic leukemia stage 0.   FISH study for CLL prognosis showed:   1. No evidence of trisomy 12 ( + 12) .  2. 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 3) are detected.  Comment: Deletions involving 13q14 are detectable by FISH in approximately 50% of persons with CLL. Among them, about 25% show biallelic or concomitant monoallelic and biallelic deletion. As a group and as the sole aberration, del( 13q) is associated with a more favorable outcome.  3. No evidence of p53 ( 17p13) deletion or amplification.  4. No evidence of ATM ( 11q22. 3) deletion.  PRIOR THERAPY: None  CURRENT THERAPY: Observation  INTERVAL HISTORY: Jessica Farmer 68 y.o. female returns to the clinic today for follow-up visit.Discussed the use of AI scribe software for clinical note transcription with the patient, who gave verbal consent to proceed.  History of Present Illness Jessica Farmer is a 68 year old female with stage zero chronic lymphocytic leukemia who presents for evaluation and repeat blood work.  She is currently in an observation period for stage zero chronic lymphocytic leukemia. No new symptoms have emerged since her last visit. No fatigue, weakness, night sweats, or weight loss beyond what is usual for her. Additionally, no swollen glands in her neck, under her arms, or in her pelvic area.  Her recent complete blood count (CBC) shows a white blood cell count of 16.5, which is lower than the previous count of 21.1.    MEDICAL HISTORY: Past Medical History:  Diagnosis Date   Arthritis, lumbar spine    Eczema    Esophageal reflux    Hyperlipemia    Hypothyroidism    Infected sebaceous cyst    right breast, right upper abd    ALLERGIES:  has no  known allergies.  MEDICATIONS:  Current Outpatient Medications  Medication Sig Dispense Refill   Calcium Carbonate (CALCIUM 500 PO) Take 500 mg by mouth daily.     CRESTOR 10 MG tablet Take 1 tablet by mouth daily.     esomeprazole (NEXIUM) 40 MG capsule Take 1 capsule by mouth daily.     gabapentin  (NEURONTIN ) 100 MG capsule Take 1-3 capsules (100-300 mg total) by mouth 3 (three) times daily.     levothyroxine (SYNTHROID) 100 MCG tablet Take 100 mcg by mouth every morning.     Multiple Vitamin (MULTIVITAMIN WITH MINERALS) TABS tablet Take 1 tablet by mouth daily.     rosuvastatin (CRESTOR) 10 MG tablet Take 10 mg by mouth daily.     valACYclovir (VALTREX) 500 MG tablet Take by mouth.     No current facility-administered medications for this visit.    SURGICAL HISTORY:  Past Surgical History:  Procedure Laterality Date   BREAST CYST EXCISION Right 11/16/2017   Procedure: EXCISION OF TWO INFECTED SEBACEOUS CYST, RIGHT BREAST AND RIGHT UPPER ABDOMINAL WALL;  Surgeon: Belinda Cough, MD;  Location: [facility];  Service: General;  Laterality: Right;  LMA   dental implants Right 04/2014   bottom jaw   VAGINAL HYSTERECTOMY  age 33    REVIEW OF SYSTEMS:  A comprehensive review of systems was negative.   PHYSICAL EXAMINATION: General appearance: alert, cooperative, and no distress Head: Normocephalic, without obvious abnormality, atraumatic  Neck: no adenopathy, no JVD, supple, symmetrical, trachea midline, and thyroid  not enlarged, symmetric, no tenderness/mass/nodules Lymph nodes: Cervical, supraclavicular, and axillary nodes normal. Resp: clear to auscultation bilaterally Back: symmetric, no curvature. ROM normal. No CVA tenderness. Cardio: regular rate and rhythm, S1, S2 normal, no murmur, click, rub or gallop GI: soft, non-tender; bowel sounds normal; no masses,  no organomegaly Extremities: extremities normal, atraumatic, no cyanosis or edema  ECOG PERFORMANCE  STATUS: 1 - Symptomatic but completely ambulatory  Blood pressure 117/69, pulse 86, temperature 98.1 F (36.7 C), temperature source Temporal, resp. rate 17, height 5' 3 (1.6 m), weight 168 lb (76.2 kg), SpO2 98%.  LABORATORY DATA: Lab Results  Component Value Date   WBC 21.1 (H) 02/11/2023   HGB 15.2 (H) 02/11/2023   HCT 46.8 (H) 02/11/2023   MCV 90.7 02/11/2023   PLT 179 02/11/2023      Chemistry      Component Value Date/Time   NA 144 02/11/2023 1306   K 3.7 02/11/2023 1306   CL 108 02/11/2023 1306   CO2 31 02/11/2023 1306   BUN 13 02/11/2023 1306   CREATININE 0.80 02/11/2023 1306      Component Value Date/Time   CALCIUM 9.6 02/11/2023 1306   ALKPHOS 65 02/11/2023 1306   AST 13 (L) 02/11/2023 1306   ALT 14 02/11/2023 1306   BILITOT 0.4 02/11/2023 1306       RADIOGRAPHIC STUDIES: No results found.  ASSESSMENT AND PLAN: This is a very pleasant 68 years old white female recently diagnosed with chronic lymphocytic leukemia in November 2024.  She is currently on observation and feeling fine. Prognostic studies by FISH showed del 13 abnormality which is usually consistent with good prognosis. The patient is currently on observation and she is feeling fine. Repeat CBC today showed total white blood count of 16.5. Assessment and Plan Assessment & Plan Chronic lymphocytic leukemia, stage 0, under observation Chronic lymphocytic leukemia (CLL), stage 0, currently under observation. The condition is well-managed with a decrease in white blood cell count from 21.1 to 16.5, indicating no progression. No new symptoms such as fatigue, weakness, night sweats, weight loss, or lymphadenopathy reported. - Continue observation for CLL. - Schedule follow-up appointment in six months. - Instruct to report any new symptoms such as weight loss, night sweats, or swollen glands. The patient was advised to call immediately if she has any concerning symptoms in the interval.  The patient  voices understanding of current disease status and treatment options and is in agreement with the current care plan.  All questions were answered. The patient knows to call the clinic with any problems, questions or concerns. We can certainly see the patient much sooner if necessary.  The total time spent in the appointment was 20 minutes.  Disclaimer: This note was dictated with voice recognition software. Similar sounding words can inadvertently be transcribed and may not be corrected upon review.

## 2023-08-13 ENCOUNTER — Telehealth: Payer: Self-pay | Admitting: Internal Medicine

## 2023-08-13 NOTE — Telephone Encounter (Signed)
Scheduled appointments with the patient

## 2023-09-19 DIAGNOSIS — U071 COVID-19: Secondary | ICD-10-CM | POA: Diagnosis not present

## 2023-10-04 ENCOUNTER — Telehealth: Payer: Self-pay | Admitting: Diagnostic Neuroimaging

## 2023-10-04 NOTE — Telephone Encounter (Signed)
 Request to cx appointment

## 2023-10-05 ENCOUNTER — Telehealth: Admitting: Diagnostic Neuroimaging

## 2023-11-07 NOTE — Progress Notes (Signed)
 Subjective:   HPI:  Jessica Farmer is a 68 y.o. female PMH significant for RIGHT TN s/p GKRS 7.3.25 w/ Tatter/Chan, who presents today for 56-month post-procedure follow-up.  Today, she states that she is doing significantly better since the Gamma knife. She actually states that this is one of the things that she is most happy that she has ever done due to how much better she feels. She still has some occasional facial pain, but it is much less frequent and severe.she might have some occasional paresthesias after the pain.   The following portions of the patient's history were reviewed and updated as appropriate: allergies, current medications, past family history, past medical history, past social history, past surgical history and problem list.  Review of Systems: Review of Systems Reviewed Systems including Constitutional, HEENT, Eyes, Respiratory, Cardiovascular, gastrointestinal, endocrine, GU, musculoskeletal, skin, allergy/immune, Neurologic, Hematologic, and psychiatric. Positive for: see HPI  Objective:  Physical Exam: Neurological Exam Physical Exam A&O, NAD, NCAT, RRR. NL Gait.  Imaging:  Assessment:  Assessment   1. Trigeminal neuralgia of right side of face      2. Status post gamma knife treatment         Very pleasant 68 y.o. female seen 4- months post GKRS for TN. She is responding as hoped to the radiation.     Plan:  Plan   # Release to PRN follow-up  On the day of the visit I spent 15 minutes preparing to see the patient, counseling and educating the patient/caregiver, and documenting clinical information in the electronic medical record.This time does not include any time spent performing procedures or assesments that are separately billable.

## 2023-11-09 DIAGNOSIS — G500 Trigeminal neuralgia: Secondary | ICD-10-CM | POA: Diagnosis not present

## 2023-11-09 DIAGNOSIS — Z923 Personal history of irradiation: Secondary | ICD-10-CM | POA: Diagnosis not present

## 2024-02-10 NOTE — Progress Notes (Unsigned)
 [facility] OFFICE PROGRESS NOTE  Jessica Artist PARAS, MD [address] [place] [address]  DIAGNOSIS: Chronic lymphocytic leukemia stage 0.    FISH study for CLL prognosis showed:    1. No evidence of trisomy 12 ( + 12) .  2. 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 3) are detected.  Comment: Deletions involving 13q14 are detectable by FISH in approximately 50% of persons with CLL. Among them, about 25% show biallelic or concomitant monoallelic and biallelic deletion. As a group and as the sole aberration, del( 13q) is associated with a more favorable outcome.  3. No evidence of p53 ( 17p13) deletion or amplification.  4. No evidence of ATM ( 11q22. 3) deletion.  PRIOR THERAPY: None  CURRENT THERAPY: Observation   INTERVAL HISTORY: Jessica Farmer 69 y.o. female returns to clinic today for follow-up visit.  The patient was last seen in the clinic 6 months ago by Dr. Sherrod.  She is followed for stage 0 chronic lymphocytic leukemia.   The patient denies any major changes in her health since she was last seen.  He denies any fever, chills, night sweats, or unexplained weight loss. He denies any abnormal bleeding or bruising. Denies new lymphocytosis. Denies abdominal pain or bloating.   Denies any abnormal signs and symptoms of infection. She is here for evaluation and repeat blood work.   MEDICAL HISTORY: Past Medical History:  Diagnosis Date   Arthritis, lumbar spine    Eczema    Esophageal reflux    Hyperlipemia    Hypothyroidism    Infected sebaceous cyst    right breast, right upper abd    ALLERGIES:  has no known allergies.  MEDICATIONS:  Current Outpatient Medications  Medication Sig Dispense Refill   Calcium Carbonate (CALCIUM 500 PO) Take 500 mg by mouth daily.     CRESTOR 10 MG tablet Take 1 tablet by mouth daily.     esomeprazole (NEXIUM) 40 MG capsule Take 1 capsule by mouth daily.     gabapentin   (NEURONTIN ) 100 MG capsule Take 1-3 capsules (100-300 mg total) by mouth 3 (three) times daily.     levothyroxine (SYNTHROID) 100 MCG tablet Take 100 mcg by mouth every morning.     Multiple Vitamin (MULTIVITAMIN WITH MINERALS) TABS tablet Take 1 tablet by mouth daily.     rosuvastatin (CRESTOR) 10 MG tablet Take 10 mg by mouth daily.     valACYclovir (VALTREX) 500 MG tablet Take by mouth.     No current facility-administered medications for this visit.    SURGICAL HISTORY:  Past Surgical History:  Procedure Laterality Date   BREAST CYST EXCISION Right 11/16/2017   Procedure: EXCISION OF TWO INFECTED SEBACEOUS CYST, RIGHT BREAST AND RIGHT UPPER ABDOMINAL WALL;  Surgeon: Belinda Cough, MD;  Location: [facility];  Service: General;  Laterality: Right;  LMA   dental implants Right 04/2014   bottom jaw   VAGINAL HYSTERECTOMY  age 86    REVIEW OF SYSTEMS:   Review of Systems  Constitutional: Negative for appetite change, chills, fatigue, fever and unexpected weight change.  HENT:   Negative for mouth sores, nosebleeds, sore throat and trouble swallowing.   Eyes: Negative for eye problems and icterus.  Respiratory: Negative for cough, hemoptysis, shortness of breath and wheezing.   Cardiovascular: Negative for chest pain and leg swelling.  Gastrointestinal: Negative for abdominal pain, constipation, diarrhea, nausea and  vomiting.  Genitourinary: Negative for bladder incontinence, difficulty urinating, dysuria, frequency and hematuria.   Musculoskeletal: Negative for back pain, gait problem, neck pain and neck stiffness.  Skin: Negative for itching and rash.  Neurological: Negative for dizziness, extremity weakness, gait problem, headaches, light-headedness and seizures.  Hematological: Negative for adenopathy. Does not bruise/bleed easily.  Psychiatric/Behavioral: Negative for confusion, depression and sleep disturbance. The patient is not nervous/anxious.     PHYSICAL  EXAMINATION:  There were no vitals taken for this visit.  ECOG PERFORMANCE STATUS: {CHL ONC ECOG [id number]  Physical Exam  Constitutional: Oriented to person, place, and time and well-developed, well-nourished, and in no distress. No distress.  HENT:  Head: Normocephalic and atraumatic.  Mouth/Throat: Oropharynx is clear and moist. No oropharyngeal exudate.  Eyes: Conjunctivae are normal. Right eye exhibits no discharge. Left eye exhibits no discharge. No scleral icterus.  Neck: Normal range of motion. Neck supple.  Cardiovascular: Normal rate, regular rhythm, normal heart sounds and intact distal pulses.   Pulmonary/Chest: Effort normal and breath sounds normal. No respiratory distress. No wheezes. No rales.  Abdominal: Soft. Bowel sounds are normal. Exhibits no distension and no mass. There is no tenderness.  Musculoskeletal: Normal range of motion. Exhibits no edema.  Lymphadenopathy:    No cervical adenopathy.  Neurological: Alert and oriented to person, place, and time. Exhibits normal muscle tone. Gait normal. Coordination normal.  Skin: Skin is warm and dry. No rash noted. Not diaphoretic. No erythema. No pallor.  Psychiatric: Mood, memory and judgment normal.  Vitals reviewed.  LABORATORY DATA: Lab Results  Component Value Date   WBC 16.5 (H) 08/12/2023   HGB 14.1 08/12/2023   HCT 43.3 08/12/2023   MCV 88.0 08/12/2023   PLT 159 08/12/2023      Chemistry      Component Value Date/Time   NA 143 08/12/2023 1314   K 4.1 08/12/2023 1314   CL 107 08/12/2023 1314   CO2 30 08/12/2023 1314   BUN 12 08/12/2023 1314   CREATININE 0.81 08/12/2023 1314      Component Value Date/Time   CALCIUM 9.3 08/12/2023 1314   ALKPHOS 63 08/12/2023 1314   AST 13 (L) 08/12/2023 1314   ALT 13 08/12/2023 1314   BILITOT 0.4 08/12/2023 1314       RADIOGRAPHIC STUDIES:  No results found.   ASSESSMENT/PLAN:  This is a very pleasant 69 year old Caucasian female recently diagnosed  with chronic lymphocytic leukemia in November 2024.  She is currently on observation and feeling fine. Prognostic studies by FISH showed del 13 abnormality which is usually consistent with good prognosis. The patient is currently on observation.   Labs were reviewed. Her WBC is ***.   Recommend he continue on observation with repeat labs in 6 months.   The patient was advised to call immediately if she has any concerning symptoms in the interval. The patient voices understanding of current disease status and treatment options and is in agreement with the current care plan. All questions were answered. The patient knows to call the clinic with any problems, questions or concerns. We can certainly see the patient much sooner if necessary    No orders of the defined types were placed in this encounter.    I spent {CHL ONC TIME VISIT - DTPQU:[id number]} counseling the patient face to face. The total time spent in the appointment was {CHL ONC TIME VISIT - DTPQU:[id number]}.  Aiman Sonn L Ammie Warrick, PA-C 02/10/24

## 2024-02-14 ENCOUNTER — Inpatient Hospital Stay

## 2024-02-14 ENCOUNTER — Inpatient Hospital Stay: Admitting: Physician Assistant

## 2024-02-14 ENCOUNTER — Inpatient Hospital Stay: Admitting: Internal Medicine
